# Patient Record
Sex: Female | Born: 1955 | Race: White | Hispanic: No | Marital: Married | Smoking: Never smoker
Health system: Southern US, Community
[De-identification: ages and names within clinical notes are randomized; demographics above are authoritative.]

## PROBLEM LIST (undated history)

## (undated) DIAGNOSIS — Z780 Asymptomatic menopausal state: Secondary | ICD-10-CM

## (undated) DIAGNOSIS — I10 Essential (primary) hypertension: Secondary | ICD-10-CM

## (undated) DIAGNOSIS — I7300 Raynaud's syndrome without gangrene: Secondary | ICD-10-CM

## (undated) DIAGNOSIS — T8859XA Other complications of anesthesia, initial encounter: Secondary | ICD-10-CM

## (undated) DIAGNOSIS — Z9889 Other specified postprocedural states: Secondary | ICD-10-CM

## (undated) HISTORY — DX: Asymptomatic menopausal state: Z78.0

## (undated) HISTORY — PX: EYE SURGERY: SHX253

## (undated) HISTORY — PX: FOOT SURGERY: SHX648

## (undated) HISTORY — PX: CATARACT EXTRACTION: SUR2

---

## 1978-07-15 HISTORY — PX: DERMOID CYST  EXCISION: SHX1452

## 1998-01-17 ENCOUNTER — Other Ambulatory Visit: Admission: RE | Admit: 1998-01-17 | Discharge: 1998-01-17 | Payer: Self-pay | Admitting: Obstetrics and Gynecology

## 1998-03-07 ENCOUNTER — Other Ambulatory Visit: Admission: RE | Admit: 1998-03-07 | Discharge: 1998-03-07 | Payer: Self-pay | Admitting: Obstetrics and Gynecology

## 1999-01-25 ENCOUNTER — Other Ambulatory Visit: Admission: RE | Admit: 1999-01-25 | Discharge: 1999-01-25 | Payer: Self-pay | Admitting: Obstetrics and Gynecology

## 1999-04-04 ENCOUNTER — Other Ambulatory Visit: Admission: RE | Admit: 1999-04-04 | Discharge: 1999-04-04 | Payer: Self-pay | Admitting: Obstetrics and Gynecology

## 1999-04-04 ENCOUNTER — Encounter (INDEPENDENT_AMBULATORY_CARE_PROVIDER_SITE_OTHER): Payer: Self-pay

## 2000-01-14 ENCOUNTER — Other Ambulatory Visit: Admission: RE | Admit: 2000-01-14 | Discharge: 2000-01-14 | Payer: Self-pay | Admitting: Obstetrics and Gynecology

## 2000-05-01 ENCOUNTER — Encounter: Admission: RE | Admit: 2000-05-01 | Discharge: 2000-05-01 | Payer: Self-pay | Admitting: Orthopedic Surgery

## 2000-05-01 ENCOUNTER — Encounter: Payer: Self-pay | Admitting: Orthopedic Surgery

## 2000-07-29 ENCOUNTER — Other Ambulatory Visit: Admission: RE | Admit: 2000-07-29 | Discharge: 2000-07-29 | Payer: Self-pay | Admitting: Obstetrics and Gynecology

## 2001-03-31 ENCOUNTER — Other Ambulatory Visit: Admission: RE | Admit: 2001-03-31 | Discharge: 2001-03-31 | Payer: Self-pay | Admitting: Obstetrics and Gynecology

## 2001-05-06 ENCOUNTER — Encounter (INDEPENDENT_AMBULATORY_CARE_PROVIDER_SITE_OTHER): Payer: Self-pay

## 2001-05-06 ENCOUNTER — Other Ambulatory Visit: Admission: RE | Admit: 2001-05-06 | Discharge: 2001-05-06 | Payer: Self-pay | Admitting: Otolaryngology

## 2001-05-06 ENCOUNTER — Encounter: Payer: Self-pay | Admitting: Orthopedic Surgery

## 2001-05-06 ENCOUNTER — Encounter: Admission: RE | Admit: 2001-05-06 | Discharge: 2001-05-06 | Payer: Self-pay | Admitting: Orthopedic Surgery

## 2002-04-05 ENCOUNTER — Other Ambulatory Visit: Admission: RE | Admit: 2002-04-05 | Discharge: 2002-04-05 | Payer: Self-pay | Admitting: Obstetrics and Gynecology

## 2002-08-05 ENCOUNTER — Encounter: Payer: Self-pay | Admitting: Orthopedic Surgery

## 2002-08-05 ENCOUNTER — Encounter: Admission: RE | Admit: 2002-08-05 | Discharge: 2002-08-05 | Payer: Self-pay | Admitting: Orthopedic Surgery

## 2002-09-09 ENCOUNTER — Encounter: Payer: Self-pay | Admitting: Gastroenterology

## 2002-09-09 ENCOUNTER — Encounter: Admission: RE | Admit: 2002-09-09 | Discharge: 2002-09-09 | Payer: Self-pay | Admitting: Gastroenterology

## 2003-04-19 ENCOUNTER — Other Ambulatory Visit: Admission: RE | Admit: 2003-04-19 | Discharge: 2003-04-19 | Payer: Self-pay | Admitting: Obstetrics and Gynecology

## 2004-02-08 ENCOUNTER — Encounter: Admission: RE | Admit: 2004-02-08 | Discharge: 2004-02-08 | Payer: Self-pay | Admitting: Obstetrics and Gynecology

## 2004-04-18 ENCOUNTER — Ambulatory Visit (HOSPITAL_BASED_OUTPATIENT_CLINIC_OR_DEPARTMENT_OTHER): Admission: RE | Admit: 2004-04-18 | Discharge: 2004-04-18 | Payer: Self-pay | Admitting: Obstetrics and Gynecology

## 2004-04-18 ENCOUNTER — Ambulatory Visit (HOSPITAL_COMMUNITY): Admission: RE | Admit: 2004-04-18 | Discharge: 2004-04-18 | Payer: Self-pay | Admitting: Obstetrics and Gynecology

## 2004-04-18 ENCOUNTER — Encounter (INDEPENDENT_AMBULATORY_CARE_PROVIDER_SITE_OTHER): Payer: Self-pay | Admitting: Specialist

## 2005-02-14 ENCOUNTER — Encounter: Admission: RE | Admit: 2005-02-14 | Discharge: 2005-02-14 | Payer: Self-pay | Admitting: Orthopedic Surgery

## 2005-03-14 ENCOUNTER — Encounter: Admission: RE | Admit: 2005-03-14 | Discharge: 2005-03-14 | Payer: Self-pay | Admitting: Orthopedic Surgery

## 2005-03-26 ENCOUNTER — Encounter: Admission: RE | Admit: 2005-03-26 | Discharge: 2005-03-26 | Payer: Self-pay | Admitting: Orthopedic Surgery

## 2005-06-03 ENCOUNTER — Ambulatory Visit: Admission: RE | Admit: 2005-06-03 | Discharge: 2005-06-04 | Payer: Self-pay | Admitting: Plastic Surgery

## 2005-06-03 ENCOUNTER — Ambulatory Visit (HOSPITAL_COMMUNITY): Admission: RE | Admit: 2005-06-03 | Discharge: 2005-06-04 | Payer: Self-pay | Admitting: Neurosurgery

## 2005-07-15 HISTORY — PX: NECK SURGERY: SHX720

## 2006-02-25 ENCOUNTER — Encounter: Admission: RE | Admit: 2006-02-25 | Discharge: 2006-02-25 | Payer: Self-pay | Admitting: Obstetrics and Gynecology

## 2007-03-23 ENCOUNTER — Encounter: Admission: RE | Admit: 2007-03-23 | Discharge: 2007-03-23 | Payer: Self-pay | Admitting: Obstetrics and Gynecology

## 2007-03-25 ENCOUNTER — Encounter: Admission: RE | Admit: 2007-03-25 | Discharge: 2007-03-25 | Payer: Self-pay | Admitting: Obstetrics and Gynecology

## 2007-11-26 ENCOUNTER — Ambulatory Visit (HOSPITAL_COMMUNITY): Admission: RE | Admit: 2007-11-26 | Discharge: 2007-11-26 | Payer: Self-pay | Admitting: Gastroenterology

## 2007-11-26 ENCOUNTER — Encounter (INDEPENDENT_AMBULATORY_CARE_PROVIDER_SITE_OTHER): Payer: Self-pay | Admitting: Gastroenterology

## 2008-03-23 ENCOUNTER — Encounter: Admission: RE | Admit: 2008-03-23 | Discharge: 2008-03-23 | Payer: Self-pay | Admitting: Obstetrics and Gynecology

## 2009-03-24 ENCOUNTER — Encounter: Admission: RE | Admit: 2009-03-24 | Discharge: 2009-03-24 | Payer: Self-pay | Admitting: Obstetrics and Gynecology

## 2010-03-26 ENCOUNTER — Encounter: Admission: RE | Admit: 2010-03-26 | Discharge: 2010-03-26 | Payer: Self-pay | Admitting: Obstetrics and Gynecology

## 2010-05-08 ENCOUNTER — Ambulatory Visit: Payer: Self-pay

## 2010-07-15 HISTORY — PX: BREAST ENHANCEMENT SURGERY: SHX7

## 2010-07-30 ENCOUNTER — Ambulatory Visit
Admission: RE | Admit: 2010-07-30 | Discharge: 2010-07-30 | Payer: Self-pay | Source: Home / Self Care | Attending: Orthopedic Surgery | Admitting: Orthopedic Surgery

## 2010-08-07 LAB — ANAEROBIC CULTURE

## 2010-08-07 LAB — WOUND CULTURE

## 2010-11-27 NOTE — Op Note (Signed)
NAME:  Claudia Rivera, Claudia Rivera NO.:  [id number]   MEDICAL RECORD NO.:  [id number]          PATIENT TYPE:  AMB   LOCATION:  ENDO                         FACILITY:  [facility]   PHYSICIAN:  Petra Kuba, M.D.    DATE OF BIRTH:  1956-02-22   DATE OF PROCEDURE:  11/26/2007  DATE OF DISCHARGE:                               OPERATIVE REPORT   PROCEDURE:  Colonoscopy with biopsy.   INDICATIONS:  Patient with gas, bloating, abdominal pain, recent [place] travel.  Almost due for colonic screening.  Consent was signed  after risks, benefits, methods, options thoroughly discussed multiple  times in the past.   MEDICINES USED:  Per anesthesia propofol 150 mg, fentanyl 50 mcg, Versed  1 mg.   PROCEDURE:  Rectal inspection is pertinent for external hemorrhoids,  small.  Digital exam was negative.  The video pediatric colonoscope was  inserted, easily advanced around the colon to the cecum.  This did  require some abdominal pressure but no position changes.  No obvious  abnormality was seen on insertion.  The cecum was identified by the  appendiceal orifice and ileocecal valve.  In fact the scope was inserted  a short ways into the terminal ileum which was normal.  Photodocumentation and a few random biopsies were obtained put in the  first container.  Scope was slowly withdrawn.  The prep was adequate.  There was some liquid stool that required washing and suctioning.  On  slow withdrawal through the colon, random colon biopsies were obtained  and put in the second container.  On slow withdrawal, the cecum,  ascending, transverse and descending were normal.  In the mid to distal  sigmoid one small diverticulum was seen. As scope was withdrawn back to  the rectum, no other abnormalities were seen.  The random biopsies were  obtained and put in the second container.  Anorectal pull-through and  retroflexion confirmed some tiny to small hemorrhoids and did reveal two  tiny rectal  polyps on retroflexion, both of which were biopsied and put  in the third container.  Scope was straightened, readvanced a short ways  up the left side of the colon.  Air was suctioned, scope removed.  The  patient tolerated the procedure well.  There was no obvious immediate  complication.   ENDOSCOPIC DIAGNOSES:  1. Internal-external tiny small hemorrhoids.  2. Two tiny rectal polyps on retroflexion, status post biopsy.  3. One sigmoid diverticulum.  4. Otherwise within normal limits to the terminal ileum, status post      random biopsies throughout.   PLAN:  Await pathology.  If nondiagnostic, consider retrial with  antibiotics or Xifaxan.  Repeat colon screening in 5-10 years and happy  to see back p.r.n.           ______________________________  Petra Kuba, M.D.     MEM/MEDQ  D:  11/26/2007  T:  11/26/2007  Job:  [id number]

## 2010-11-30 NOTE — Op Note (Signed)
NAME:  Claudia Rivera, Claudia Rivera NO.:  [id number]   MEDICAL RECORD NO.:  [id number]          PATIENT TYPE:  AMB   LOCATION:  NESC                         FACILITY:  [facility]   PHYSICIAN:  Sherry A. Dickstein, M.D.DATE OF BIRTH:  October 06, 1955   DATE OF PROCEDURE:  04/18/2004  DATE OF DISCHARGE:                                 OPERATIVE REPORT   PREOPERATIVE DIAGNOSIS:  Endometrial polyp and menorrhagia.   POSTOPERATIVE DIAGNOSIS:  Endometrial polyp and menorrhagia.   SURGEON:  Sherry A. Rosalio Macadamia, M.D.   ANESTHESIA:  MAC.   PROCEDURE:  D&C, hysteroscopy with resectoscope.   INDICATIONS FOR PROCEDURE:  This is a 55 year old G3, P2-1-0-3 woman who has  menstrual periods every 28 days lasting approximately five to seven days,  with excessively heavy flow.  The patient is changing a pad every hour with  clotting.  The patient also has some irregular bleeding in between periods  at this time.  The patient had workup showing normal CBC and an ultrasound.  Ultrasound revealed thickened endometrium that was consistent with  endometrial polyp.  Because of this, the patient is brought to the operating  room for [facility], hysteroscopy with resectoscope.   FINDINGS:  Normal anteflexed uterus.  No adnexal mass.  Endometrial polyp  present.   DESCRIPTION OF PROCEDURE:  The patient was brought into the operating room  and given adequate IV sedation.  She was placed in the dorsal lithotomy  position.  Her perineum was washed with Hibiclens.  Pelvic examination was  performed.  The surgeon's gown and gloves were changed.  The patient was  draped in a sterile fashion.   A speculum was placed within the vagina.  The vagina was washed with  Hibiclens.  A paracervical block was administered with 1% Nesacaine.  The  anterior lip of the cervix was grasped with a single-tooth tenaculum.  The  cervix was dilated with Pratt dilators to a #31.  The hysteroscope was  introduced into the endometrial  cavity.  Pictures were obtained.  An  endometrial polyp was seen at the cervical lower uterine segment junction.  The endometrial polyp was resected.  Endometrial resections were taken  circumferentially from the endometrial cavity.  There was adequate  hemostasis in the endometrial cavity.  The hysteroscope was removed.  The  tenaculum was removed.  A small amount of bleeding on the cervix from the  tenaculum site was stopped with Monsel's solution.  All instruments were  removed from the vagina.   The patient was taken out of the dorsal lithotomy position.  She was  awakened.  She was moved from the operating table to the stretcher in stable  condition.   COMPLICATIONS:  None.   ESTIMATED BLOOD LOSS:  Less than 5 cc.  Sorbitol differential -70 cc.   SPECIMENS:  1.  Endometrial polyp.  2.  Endometrial resections.      SAD/MEDQ  D:  04/18/2004  T:  04/18/2004  Job:  [id number]

## 2010-11-30 NOTE — Op Note (Signed)
NAME:  Claudia Rivera, TALSMA NO.:  [id number]   MEDICAL RECORD NO.:  [id number]          PATIENT TYPE:  AMB   LOCATION:  SDS                          FACILITY:  [facility]   PHYSICIAN:  Hewitt Shorts, M.D.DATE OF BIRTH:  01-05-1956   DATE OF PROCEDURE:  06/03/2005  DATE OF DISCHARGE:                                 OPERATIVE REPORT   PREOPERATIVE DIAGNOSIS:  Cervical spondylosis, degenerative disc disease,  radiculopathy, and neck pain.   POSTOPERATIVE DIAGNOSIS:  Cervical spondylosis, degenerative disc disease,  radiculopathy, and neck pain.   PROCEDURE:  C5-C6 and C6-C7 anterior cervical discectomy and arthrodesis  with VG2 allograft and cervical plating.   SURGEON:  Hewitt Shorts, M.D.   ASSISTANT:  Stefani Dama, M.D.   ANESTHESIA:  General endotracheal anesthesia.   INDICATIONS FOR PROCEDURE:  The patient is a 55 year old woman who presented  with a right cervical radiculopathy and was found to have advanced  spondylosis and degenerative disc disease at the C5-C6 and C6-C7 levels.  The decision was made to proceed with a two level anterior cervical  discectomy and arthrodesis.   PROCEDURE:  The patient was brought to the operating room and placed under  general endotracheal anesthesia.  The patient was placed in 10 pounds of  halter traction.  The neck was prepped with Betadine solution and draped in  a sterile fashion.  A horizontal incision was made on the left side of the  neck, prior to this, the incision was infiltrated with local anesthetic with  epinephrine.  The dissection was carried down through the subcutaneous  tissue and platysma.  Bipolar cautery and electrocautery was used to  maintain hemostasis.  Dissection was carried out in an avascular plane with  the sternocleidomastoid muscle, carotid artery, and jugular vein laterally  and trachea and esophagus medially.  The ventral aspect of the vertebral  column was identified and a  localizing x-ray was taken and the C5-C6 and C6-  C7 intervertebral disc space was identified.  Discectomy was begun with  incision in the annulus and continued with microcurets and pituitary  rongeurs.  The cartilaginous endplates of the corresponding vertebra were  removed using microcuret and the X-Max drill.  The microscope was then  draped and brought onto the field to provide additional magnification,  illumination, and visualization and the remainder of the decompression was  performed using microdissection and microsurgical technique.  There was  significant posterior osteophyte overgrowth, this was removed using the X-  Max drill along with the 2 mm Kerrison punch with a thin footplate.  The  posterior longitudinal ligament, which was thickened was removed, and we  were then able to decompress the spinal canal and thecal sac.  We then  turned our attention to the neural foramina.  There was significant  spondylitic overgrowth within the neural foramina.  This spondylitic  overgrowth was carefully removed decompressing the exiting nerve roots  bilaterally at each level.  Once the decompression was completed, we  measured the height of the intervertebral disc space.  We selected a 6 mm  graft for the C5-C6 level and 7 mm graft for the C6-C7 level.  Each of the  grafts was hydrated in saline solution, positioned in the intervertebral  disc space and counter sunk.  We then selected a 35 mm tethered cervical  plate, it was positioned over the fusion construct.  We used 4 by 14 mm  screws, a single screw was placed at C6, a pair of screws was placed at C5  and C7.  Each of the screw holes was drilled and tapped and the screws  placed in alternating fashion.  Once all five screws were placed, final  tightening was done of the screws, and then an x-ray was taken which showed  the grafts to be in good position, the plate and screws in good position,  the overall alignment was good.  The  wound was irrigated with Bacitracin  solution, checked for hemostasis which was established and confirmed, and  then we proceeded with closure.  The platysma was closed with interrupted  inverted 2-0 Vicryl suture, the subcutaneous and subcuticular layer was  closed with interrupted inverted 3-0 Vicryl sutures, and the skin was  reapproximated with Dermabond.  The patient tolerated the procedure well.  Estimated blood loss was 50 mL.  Sponge and needle counts were correct.  At  the completion of the neurosurgical procedure, the patient was scheduled for  a second procedure to be performed by Dr. Delia Chimes, specifically, a  bilateral blepharoplasty, and the patient's care was turned over to Dr.  Benna Dunks.  The patient was continued under general anesthesia and will be  evaluated subsequently in the recovery room after the plastic surgical  procedure is completed.      Hewitt Shorts, M.D.  Electronically Signed     RWN/MEDQ  D:  06/03/2005  T:  06/03/2005  Job:  [id number]

## 2010-11-30 NOTE — Op Note (Signed)
NAME:  Claudia Rivera, Claudia Rivera NO.:  [id number]   MEDICAL RECORD NO.:  [id number]          PATIENT TYPE:  AMB   LOCATION:  SDS                          FACILITY:  [facility]   PHYSICIAN:  Alfredia Ferguson, M.D.  DATE OF BIRTH:  [birth year]   DATE OF PROCEDURE:  06/03/2005  DATE OF DISCHARGE:                                 OPERATIVE REPORT   PREOPERATIVE DIAGNOSES:  1.  Dermatochalasia, upper eyelids.  2.  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   POSTOPERATIVE DIAGNOSES:  1.  Dermatochalasia, upper eyelids.  2.  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   OPERATION PERFORMED:  1.  Upper eyelid blepharoplasty.  2.  Lower lid blepharoplasty.   SURGEON:  Alfredia Ferguson, M.D.   ANESTHESIA:  General endotracheal anesthesia.   INDICATION FOR SURGERY:  This is a 55 year old woman who is undergoing  cervical laminectomy, who wishes to also have her eyes done at the  completion of the procedure.  She complains of excess skin of her upper  eyelids and laxity of skin of the lower eyelids with puffiness in the lower  lids.  The patient understands the risk of surgery including bleeding,  infection, unsightly scarring, asymmetry, ectropion, over-resection of  eyelid skin, lagophthalmos, blindness, the need for secondary surgery, and  overall dissatisfaction with the results.  In spite of these and other risks  discussed with the patient, the patient wishes to proceed with the surgery.   DESCRIPTION OF OPERATION:  I was summoned to the operating at the conclusion  of the laminectomy.  The patient's excess skin was marked and a subciliary  skin mark was placed in both lower eyelids.  Local anesthesia using  Xylocaine and 1:1000 epinephrine was infiltrated in both upper and lower  eyelids.  The face was prepped with Betadine and  draped with sterile  drapes.  Attention was first turned to the left upper  eyelid, where a skin  excision was carried out in elliptical fashion.  A narrow sliver of  orbicularis oculi muscle was then removed.  It was approximately 5 mm in  width.  The septum orbitale was opened medially and a small medial fat pad  was visualized.  This fat pad was teased out of its anatomic bed, clamped at  the base, excised, and the base was cauterized.  Hemostasis was meticulously  maintained.  A small tunnel was made in the suborbicularis plane going  inferiorly along the lateral orbital rim for a distance about a centimeter  for later use for tacking up the lower orbicularis muscle.  A left  subciliary incision was now made and a skin flap was elevated for distance  of 5 mm.  The skin flap was now deepened to get beneath the orbicularis  muscle and the dissection continued down to the orbital rim.  An opening was  made the septum orbitale medially in the middle and laterally.  A large  amount of pseudo-herniated fat was identified.  This fat was teased out its  anatomic bed, clamped at the base, excised, and the base was cauterized.  After achieving meticulous hemostasis, a small pennant of orbicularis muscle  was mobilized from the undersurface of the lateral skin flap.  This skin  flap was now pulled up through the previously-created tunnel on the left  side in lateral aspect of the lid incision.  The orbicularis muscle was  pulled a superior direction and tacked down to the lateral orbital rim just  above lateral pupillary line.  The lower eyelid skin was now redraped in the  superior direction.  A conservative skin excision of the excess skin was  carried out.  Both areas were meticulously inspected for hemostasis and then  irrigated.  Prior to closure in and both incisions, attention was turned to  her right side, where an identical procedure was performed.  After excising  excess skin above and below and excising fat below, attention was redirected  to the left side,  where closure was then was carried out.  The left upper  eyelid was closed with a running 6-0 fast-absorbing gut.  A single 6-0 nylon  suture was placed in the midportion of the upper eyelid skin to suspend it  closed.  The lower eyelid incision was closed by approximating the lateral  one-fourth the incision with interrupted simple sutures of 6-0 nylon.  The  remainder of the lower level incision was closed with a rapid-absorbing 6-0  gut suture.  Closure on the right side was carried out in identical fashion.  Symmetry appeared to be acceptable at conclusion of the procedure.  There  was minimal blood loss throughout.  The patient's face was cleansed and  dried and cold packs were placed over both eyes.  The patient was awakened,  extubated and transported to the recovery room in satisfactory condition.      Alfredia Ferguson, M.D.  Electronically Signed     WBB/MEDQ  D:  06/03/2005  T:  06/04/2005  Job:  [id number]

## 2010-12-26 ENCOUNTER — Other Ambulatory Visit: Payer: Self-pay | Admitting: Obstetrics & Gynecology

## 2010-12-26 DIAGNOSIS — Z780 Asymptomatic menopausal state: Secondary | ICD-10-CM

## 2011-02-04 ENCOUNTER — Other Ambulatory Visit: Payer: Self-pay | Admitting: Sports Medicine

## 2011-02-04 DIAGNOSIS — IMO0002 Reserved for concepts with insufficient information to code with codable children: Secondary | ICD-10-CM

## 2011-02-06 ENCOUNTER — Ambulatory Visit
Admission: RE | Admit: 2011-02-06 | Discharge: 2011-02-06 | Disposition: A | Discharge status: Home / Self Care | Payer: BC Managed Care – PPO | Source: Ambulatory Visit | Attending: Sports Medicine | Admitting: Sports Medicine

## 2011-02-06 DIAGNOSIS — IMO0002 Reserved for concepts with insufficient information to code with codable children: Secondary | ICD-10-CM

## 2011-02-06 MED ORDER — IOHEXOL 300 MG/ML  SOLN
1.0000 mL | Freq: Once | INTRAMUSCULAR | Status: AC | PRN
  Administered 2011-02-06: 1 mL via EPIDURAL

## 2011-02-06 MED ORDER — TRIAMCINOLONE ACETONIDE 40 MG/ML IJ SUSP (RADIOLOGY)
60.0000 mg | Freq: Once | INTRAMUSCULAR | Status: AC
  Administered 2011-02-06: 60 mg via EPIDURAL

## 2011-03-05 ENCOUNTER — Other Ambulatory Visit: Payer: Self-pay | Admitting: Obstetrics & Gynecology

## 2011-03-05 DIAGNOSIS — Z1231 Encounter for screening mammogram for malignant neoplasm of breast: Secondary | ICD-10-CM

## 2011-03-19 ENCOUNTER — Ambulatory Visit (INDEPENDENT_AMBULATORY_CARE_PROVIDER_SITE_OTHER): Payer: BC Managed Care – PPO | Admitting: Internal Medicine

## 2011-03-19 ENCOUNTER — Encounter: Payer: Self-pay | Admitting: Internal Medicine

## 2011-03-19 VITALS — BP 136/86 | HR 65 | Temp 97.4°F | Resp 12 | Ht 68.0 in | Wt 106.0 lb

## 2011-03-19 DIAGNOSIS — Z780 Asymptomatic menopausal state: Secondary | ICD-10-CM | POA: Insufficient documentation

## 2011-03-19 DIAGNOSIS — N951 Menopausal and female climacteric states: Secondary | ICD-10-CM

## 2011-03-19 DIAGNOSIS — G43909 Migraine, unspecified, not intractable, without status migrainosus: Secondary | ICD-10-CM | POA: Insufficient documentation

## 2011-03-19 DIAGNOSIS — R636 Underweight: Secondary | ICD-10-CM | POA: Insufficient documentation

## 2011-03-19 LAB — FOLLICLE STIMULATING HORMONE: FSH: 76.3 m[IU]/mL

## 2011-03-19 LAB — COMPREHENSIVE METABOLIC PANEL
ALT: 19 U/L (ref 0–35)
AST: 30 U/L (ref 0–37)
Albumin: 4.5 g/dL (ref 3.5–5.2)
Alkaline Phosphatase: 44 U/L (ref 39–117)
BUN: 15 mg/dL (ref 6–23)
CO2: 28 mEq/L (ref 19–32)
Calcium: 9.6 mg/dL (ref 8.4–10.5)
Chloride: 105 mEq/L (ref 96–112)
Creat: 0.67 mg/dL (ref 0.50–1.10)
Glucose, Bld: 91 mg/dL (ref 70–99)
Potassium: 4.2 mEq/L (ref 3.5–5.3)
Sodium: 142 mEq/L (ref 135–145)
Total Bilirubin: 0.3 mg/dL (ref 0.3–1.2)
Total Protein: 6.6 g/dL (ref 6.0–8.3)

## 2011-03-19 LAB — POCT URINALYSIS DIPSTICK
Bilirubin, UA: NEGATIVE
Blood, UA: NEGATIVE
Glucose, UA: NEGATIVE
Ketones, UA: NEGATIVE
Leukocytes, UA: NEGATIVE
Nitrite, UA: NEGATIVE
Protein, UA: NEGATIVE
Spec Grav, UA: 1.01
Urobilinogen, UA: NORMAL
pH, UA: 6.5

## 2011-03-19 LAB — LUTEINIZING HORMONE: LH: 27.8 m[IU]/mL

## 2011-03-19 MED ORDER — PROGESTERONE MICRONIZED 100 MG PO CAPS: 100.0000 mg | ORAL_CAPSULE | Freq: Every day | ORAL | Status: DC

## 2011-03-19 MED ORDER — ESCITALOPRAM OXALATE 10 MG PO TABS: 10.0000 mg | ORAL_TABLET | Freq: Every day | ORAL | Status: DC

## 2011-03-19 MED ORDER — ESTRADIOL 0.05 MG/24HR TD PTTW: MEDICATED_PATCH | TRANSDERMAL | Status: DC

## 2011-03-19 NOTE — Patient Instructions (Signed)
Take vivelle dot and change 2 times per week  Take prometrium every day  Schedule CPE -  Send mammogram report to me

## 2011-03-19 NOTE — Progress Notes (Signed)
Subjective:    Patient ID: Claudia Rivera, female    DOB: Aug 02, 1955, 55 y.o.   MRN: [id number]  HPI Claudia Rivera is here as a new pt for the first visit.  Claudia Rivera has a history of migraine headaches treated by Dr. Sandria Manly with Maxalt, and Claudia Rivera has had , menopausal symptoms.  Claudia Rivera describes Claudia Rivera was seeing Dr. Juliene Pina at [facility] and treated with Vivelle dot and was supposed to be taking progesterone as well but there was some mix up with meds.  Claudia Rivera had a few months without progesterone and [name] reports that Dr. Juliene Pina did do an ultrasound and pt was told her endometrial lining was OK.  Claudia Rivera continues to wake up at night with occasional night sweats.  [name] has no personal or FH of CVD, of breast CA, no GYN cancers,  no FH of DVT or PE, or liver disease  Her last mammogram was reported normal and Claudia Rivera is due for her yearly in a couple of weeks.  Some vaginal dryness and Claudia Rivera describes losing urine with coughing, sneezing.  Claudia Rivera does tend to get frequent UTI's by her report.  Former primary care from Dr. Farris Has or Dr. Margaretha Sheffield at her husband's office.  Allergies  Allergen Reactions  . Codeine Hives and Itching   Past Medical History  Diagnosis Date  . Migraine     Dr. Sandria Manly  . Menopause    Past Surgical History  Procedure Date  . Breast enhancement surgery 2012  . Dermoid cyst  excision 1980  . Neck surgery 2007    fusion C6/7   History   Social History  . Marital Status: Married    Spouse Name: N/A    Number of Children: N/A  . Years of Education: N/A   Occupational History  . Not on file.   Social History Main Topics  . Smoking status: Never Smoker   . Smokeless tobacco: Never Used  . Alcohol Use: 1.5 oz/week    3 drink(s) per week  . Drug Use: No  . Sexually Active: Yes    Birth Control/ Protection: Post-menopausal   Other Topics Concern  . Not on file   Social History Narrative  . No narrative on file   Family History  Problem Relation Age of Onset  . COPD Mother   . Cancer Father    prostate   Patient Active Problem List  Diagnoses  . Migraine  . Menopause  . Underweight   No current outpatient prescriptions on file prior to visit.       Review of Systems    No chest pain, no Sob, no Le edema  Objective:   Physical Exam  Physical Exam  Nursing note and vitals reviewed.  Constitutional: Claudia Rivera is oriented to person, place, and time. Claudia Rivera is pleasant and appears thin.  See BMI  HENT:  Head: Normocephalic and atraumatic.  Cardiovascular: Normal rate and regular rhythm. Exam reveals no gallop and no friction rub.  No murmur heard.  Pulmonary/Chest: Breath sounds normal. Claudia Rivera has no wheezes. Claudia Rivera has no rales.  Neurological: Claudia Rivera is alert and oriented to person, place, and time.  Skin: Skin is warm and dry.  Psychiatric: Claudia Rivera has a normal mood and affect. Her behavior is normal.         Assessment & Plan:  1)  Symptomatic Menopause:  I discussed in detail the risks/benefits of HT.  Claudia Rivera is aware of risks of CV disease and cancers.  Pt has signed the information sheet.  Will initiate Vivelle dot .05 mg 2 times per week with daily Prometrium 100 mg.  Claudia Rivera voices understanding.  Claudia Rivera is to have next mammogram sent to me  2)  Mood irritability:  Claudia Rivera is on Lexapro for this and OK to re-order  3)  Underweight  Claudia Rivera is to schedule a CPE and will further discuss this at that time  4)  Mixed incontinence:  Will check U/A today.  Will further discuss at CPE  I spent 45 minutes with this pt

## 2011-03-26 ENCOUNTER — Encounter: Payer: Self-pay | Admitting: Internal Medicine

## 2011-03-28 ENCOUNTER — Other Ambulatory Visit: Payer: Self-pay | Admitting: Internal Medicine

## 2011-03-28 ENCOUNTER — Ambulatory Visit
Admission: RE | Admit: 2011-03-28 | Discharge: 2011-03-28 | Disposition: A | Discharge status: Home / Self Care | Payer: BC Managed Care – PPO | Source: Ambulatory Visit | Attending: Obstetrics & Gynecology | Admitting: Obstetrics & Gynecology

## 2011-03-28 DIAGNOSIS — Z780 Asymptomatic menopausal state: Secondary | ICD-10-CM

## 2011-03-28 DIAGNOSIS — Z1231 Encounter for screening mammogram for malignant neoplasm of breast: Secondary | ICD-10-CM

## 2011-04-04 ENCOUNTER — Ambulatory Visit: Payer: BC Managed Care – PPO | Admitting: Internal Medicine

## 2011-04-05 ENCOUNTER — Encounter: Payer: Self-pay | Admitting: Emergency Medicine

## 2011-04-11 ENCOUNTER — Other Ambulatory Visit: Payer: Self-pay | Admitting: Internal Medicine

## 2011-04-11 ENCOUNTER — Ambulatory Visit (INDEPENDENT_AMBULATORY_CARE_PROVIDER_SITE_OTHER): Payer: BC Managed Care – PPO | Admitting: Internal Medicine

## 2011-04-11 ENCOUNTER — Encounter: Payer: Self-pay | Admitting: Internal Medicine

## 2011-04-11 VITALS — BP 122/80 | HR 86 | Temp 97.3°F | Resp 16 | Ht 68.0 in | Wt 103.0 lb

## 2011-04-11 DIAGNOSIS — Z113 Encounter for screening for infections with a predominantly sexual mode of transmission: Secondary | ICD-10-CM

## 2011-04-11 DIAGNOSIS — Z23 Encounter for immunization: Secondary | ICD-10-CM

## 2011-04-11 DIAGNOSIS — Z0000 Encounter for general adult medical examination without abnormal findings: Secondary | ICD-10-CM

## 2011-04-11 DIAGNOSIS — G43909 Migraine, unspecified, not intractable, without status migrainosus: Secondary | ICD-10-CM

## 2011-04-11 DIAGNOSIS — Z780 Asymptomatic menopausal state: Secondary | ICD-10-CM

## 2011-04-11 DIAGNOSIS — R636 Underweight: Secondary | ICD-10-CM

## 2011-04-11 DIAGNOSIS — R634 Abnormal weight loss: Secondary | ICD-10-CM

## 2011-04-11 DIAGNOSIS — N951 Menopausal and female climacteric states: Secondary | ICD-10-CM

## 2011-04-11 DIAGNOSIS — Z1272 Encounter for screening for malignant neoplasm of vagina: Secondary | ICD-10-CM

## 2011-04-11 DIAGNOSIS — Z01419 Encounter for gynecological examination (general) (routine) without abnormal findings: Secondary | ICD-10-CM

## 2011-04-11 NOTE — Progress Notes (Signed)
Subjective:    Patient ID: Claudia Rivera, female    DOB: [birth year], 55 y.o.   MRN: [id number]  HPI  Claudia Rivera is here for comprehensive exam.    She was unable to tolerate HT as it caused an increase frequency of her migraine headaches.  She has recently had a visit with Dr. Sandria Manly who counseled to come off all meds except for her prn Maxalt, Lexapro and she was placed on Topamax which she has had for 3 weeks.  Her hot flushes have not been too bothersome.  Still has occasional vaginal dryness  See weight.  She is down 3 lbs but now on Topamax.  Upon further history Claudia Rivera describes that in Jan of 2012 she developed a staph infection of her L index finger.  She was placed on multiple antibiotics at that time which made her quite nauseated and unable to eat.  Since then Claudia Rivera's appetite has improved except for when she gets nauseated from migraines which have been happening quite frequently.  When pain in severe she will throw up.   Dr. Sandria Manly placed her on Topamax 3 weeks ago.  No diarrhea, no abd pain,  She denies FH of GI or GYN malignancies.  Had colonoscopy a few years ago by Dr. Ewing Schlein and was reported negative by    Mammogram dated 9/12 from the [facility] center is negative . Pt has implants.  Recent basic bloodwork from her husband's office is unrevealing.  TSH on low side of normal.    Allergies  Allergen Reactions  . Codeine Hives and Itching   Past Medical History  Diagnosis Date  . Migraine     Dr. Sandria Manly  . Menopause    Past Surgical History  Procedure Date  . Breast enhancement surgery 2012  . Dermoid cyst  excision 1980  . Neck surgery 2007    fusion C6/7   History   Social History  . Marital Status: Married    Spouse Name: N/A    Number of Children: N/A  . Years of Education: N/A   Occupational History  . Not on file.   Social History Main Topics  . Smoking status: Never Smoker   . Smokeless tobacco: Never Used  . Alcohol Use: 1.5 oz/week    3 drink(s) per week  . Drug  Use: No  . Sexually Active: Yes    Birth Control/ Protection: Post-menopausal   Other Topics Concern  . Not on file   Social History Narrative  . No narrative on file   Family History  Problem Relation Age of Onset  . COPD Mother   . Cancer Father     prostate   Patient Active Problem List  Diagnoses  . Migraine  . Menopause  . Underweight   Current Outpatient Prescriptions on File Prior to Visit  Medication Sig Dispense Refill  . escitalopram (LEXAPRO) 10 MG tablet Take 1 tablet (10 mg total) by mouth daily.  30 tablet  5  . rizatriptan (MAXALT) 10 MG tablet Take 10 mg by mouth as needed. May repeat in 2 hours if needed       . estradiol (VIVELLE-DOT) 0.05 MG/24HR Place patch on skin and change 2 times a week  8 patch  5  . progesterone (PROMETRIUM) 100 MG capsule Take 1 capsule (100 mg total) by mouth at bedtime.  30 capsule  5      Review of Systems No chest pain no SOB, no Le edema  No blood or change  in color of stools see HPI Objective:   Physical Exam Physical Exam  Vital signs and nursing note reviewed .  See weight Has lost 3 lbs since last visit Constitutional: She is oriented to person, place, and time. She appears thin. She is cooperative.  HENT:  Head: Normocephalic and atraumatic.  Right Ear: Tympanic membrane normal.  Left Ear: Tympanic membrane normal.  Nose: Nose normal.  Mouth/Throat: Oropharynx is clear and moist and mucous membranes are normal. No oropharyngeal exudate or posterior oropharyngeal erythema.  Eyes: Conjunctivae and EOM are normal. Pupils are equal, round, and reactive to light.  Neck: Neck supple. No JVD present. Carotid bruit is not present. No mass and no thyromegaly present.  Cardiovascular: Regular rhythm, normal heart sounds, intact distal pulses and normal pulses.  Exam reveals no gallop and no friction rub.   No murmur heard. Pulses:      Dorsalis pedis pulses are 2+ on the right side, and 2+ on the left side.    Pulmonary/Chest: Breath sounds normal. She has no wheezes. She has no rhonchi. She has no rales. Right breast exhibits no mass, no nipple discharge and no skin change. Left breast exhibits no mass, no nipple discharge and no skin change.  Abdominal: Soft. Bowel sounds are normal. She exhibits no distension and no mass. There is no hepatosplenomegaly. There is no tenderness. There is no CVA tenderness.  Genitourinary: Rectum normal, vagina normal and uterus normal. Rectal exam shows no mass. Guaiac negative stool. No labial fusion. There is no lesion on the right labia. There is no lesion on the left labia. Cervix exhibits no motion tenderness. Right adnexum displays no mass, no tenderness and no fullness. Left adnexum displays no mass, no tenderness and no fullness. No erythema around the vagina.  Musculoskeletal:       No active synovitis to any joint.    Lymphadenopathy:       Right cervical: No superficial cervical adenopathy present.      Left cervical: No superficial cervical adenopathy present.       Right axillary: No pectoral and no lateral adenopathy present.       Left axillary: No pectoral and no lateral adenopathy present.      Right: No inguinal adenopathy present.       Left: No inguinal adenopathy present.  Neurological: She is alert and oriented to person, place, and time. She has normal strength and normal reflexes. No cranial nerve deficit or sensory deficit. She displays a negative Romberg sign. Coordination and gait normal.  Skin: Skin is warm and dry. No abrasion, no bruising, no ecchymosis and no rash noted. No cyanosis. Nails show no clubbing.  Psychiatric: She has a normal mood and affect. Her speech is normal and behavior is normal.     Assessment & Plan:  1) Health Maintenance:  See scanned HM sheet  Will give influenza today.  Pap done today.  Info given on Shingles vaccine and DASH diet for heart health.  UTD with mammogram and colonoscopy.  Will schedule DEXA 2)   Underweight with wt. .loss:  Weight loss dated back to 1/12.  Will begin more extensive work up to include basic endocrine blood tests with  tumor biomarkers CEA and CA 125, along with Free T3 and Free T4 with cortisol and aldosterone levels.  Etiology of persistant wt. loss unclear at this point.  I advised Claudia Rivera to come to my office to weigh herself once a month on my scale.  She assures  me she is eating high calorie meals along with peanut butter and ice cream.  Her mammogram and colonoscopy have been negative.  She had a brain MRI with Dr. Sandria Manly that was reported normal per pt. If no improvement in weight, will need to consider endocrine consult.  EKG today shows nonspecific TWI limited to V1 otherwise normal Topamax therapy likely to complicate her ability to gain weight.  I am also concerned about effect on bone density and will schedule a DEXA 3) Risk for osteoporosis  See above 4)  Migraines:  Managed by neurologist 5)  Menopause unable to tolerate HT secondary to migraines, symptoms minimal now  She will come to office for weight in one month             Assessment & Plan:

## 2011-04-11 NOTE — Patient Instructions (Signed)
To come to my office once a month to be weighed  Return as needed

## 2011-04-12 LAB — CA 125: CA 125: 6.6 U/mL (ref 0.0–30.2)

## 2011-04-12 LAB — CORTISOL: Cortisol, Plasma: 6.1 ug/dL

## 2011-04-12 LAB — CEA: CEA: 1.1 ng/mL (ref 0.0–5.0)

## 2011-04-15 ENCOUNTER — Encounter: Payer: Self-pay | Admitting: Emergency Medicine

## 2011-04-15 LAB — ALDOSTERONE

## 2011-04-18 ENCOUNTER — Other Ambulatory Visit: Payer: BC Managed Care – PPO

## 2011-04-25 ENCOUNTER — Encounter: Payer: Self-pay | Admitting: Emergency Medicine

## 2011-05-27 ENCOUNTER — Telehealth: Payer: Self-pay | Admitting: Internal Medicine

## 2011-05-27 NOTE — Telephone Encounter (Signed)
Please call pt. and remind her to come in for a weight check here in our office.  Let her know that if she is still losing weight, I will want to get a CT of her abd and pelvis along with a CXR.  I can call her tomorrow if she want s to talk to me

## 2011-05-27 NOTE — Telephone Encounter (Signed)
Spoke with Claudia Rivera.  She states that she thinks she is doing well, does not think she needs the weight check.  When questioned if she feels like she is maintaining or gaining weight, she states she is gaining weight.  Not willing to come in for weight check.  Aware I will let DDS know

## 2011-06-25 ENCOUNTER — Telehealth: Payer: Self-pay | Admitting: Internal Medicine

## 2011-06-25 NOTE — Telephone Encounter (Signed)
Spoke with pt. approx 10;00 am .  She reports while at a party a few days ago, she had a glass of wine and experience a syncopal episode with what was described by witnesses as a seizure.  Her husband was not present to witness but was witnessed  by several physicians.  EMS was called and pt reports her glucose was in the 60's and 4 vials of IV glucose was given.  She has an appt with endocrinologist Dr. Leslie Dales today.    She reports she is eating and denies binging/purging.  I spoke with her husband who also confirms that pt. Is eating and he has not noted any signs of eating disorder behavior.  Pt did not want to come to my office for a weight check and reported to my nurse that she thought she was gaining weight.  I also spoke with Dr. 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  She did not have an MRA.  She has been on Topamax and he suggested she come off this.  Dr. Sandria Manly did recall pt had a seizure around 10 years ago.   Will discuss pt with Dr. Leslie Dales after his evaluation today.  Appropriate to rule out potential endocrine etiologies including insulinoma, adrenal pathology etc.

## 2011-06-26 ENCOUNTER — Telehealth: Payer: Self-pay | Admitting: Internal Medicine

## 2011-06-26 ENCOUNTER — Ambulatory Visit (HOSPITAL_BASED_OUTPATIENT_CLINIC_OR_DEPARTMENT_OTHER): Payer: BC Managed Care – PPO

## 2011-06-26 ENCOUNTER — Ambulatory Visit (HOSPITAL_BASED_OUTPATIENT_CLINIC_OR_DEPARTMENT_OTHER)
Admission: RE | Admit: 2011-06-26 | Discharge: 2011-06-26 | Disposition: A | Discharge status: Home / Self Care | Payer: BC Managed Care – PPO | Source: Ambulatory Visit | Attending: Internal Medicine | Admitting: Internal Medicine

## 2011-06-26 ENCOUNTER — Ambulatory Visit (INDEPENDENT_AMBULATORY_CARE_PROVIDER_SITE_OTHER): Payer: BC Managed Care – PPO | Admitting: Internal Medicine

## 2011-06-26 VITALS — BP 128/88 | HR 67 | Temp 97.0°F | Ht 68.0 in | Wt 98.0 lb

## 2011-06-26 DIAGNOSIS — R932 Abnormal findings on diagnostic imaging of liver and biliary tract: Secondary | ICD-10-CM | POA: Insufficient documentation

## 2011-06-26 DIAGNOSIS — R569 Unspecified convulsions: Secondary | ICD-10-CM

## 2011-06-26 DIAGNOSIS — K7689 Other specified diseases of liver: Secondary | ICD-10-CM | POA: Insufficient documentation

## 2011-06-26 DIAGNOSIS — E162 Hypoglycemia, unspecified: Secondary | ICD-10-CM

## 2011-06-26 DIAGNOSIS — G43909 Migraine, unspecified, not intractable, without status migrainosus: Secondary | ICD-10-CM

## 2011-06-26 DIAGNOSIS — R634 Abnormal weight loss: Secondary | ICD-10-CM

## 2011-06-26 DIAGNOSIS — J449 Chronic obstructive pulmonary disease, unspecified: Secondary | ICD-10-CM | POA: Insufficient documentation

## 2011-06-26 DIAGNOSIS — R0602 Shortness of breath: Secondary | ICD-10-CM | POA: Insufficient documentation

## 2011-06-26 DIAGNOSIS — J4489 Other specified chronic obstructive pulmonary disease: Secondary | ICD-10-CM | POA: Insufficient documentation

## 2011-06-26 MED ORDER — IOHEXOL 300 MG/ML  SOLN
100.0000 mL | Freq: Once | INTRAMUSCULAR | Status: AC | PRN
  Administered 2011-06-26: 100 mL via INTRAVENOUS

## 2011-06-26 NOTE — Telephone Encounter (Signed)
Spoke with pt.   She is undergoing endocrine work up and I will get CT of chest, abd/pelvis with contrast today.  Pt is to come to office this afternoon for weight check .  Will try to get Dr.  Altheimer's labs this afternoon

## 2011-06-27 ENCOUNTER — Encounter: Payer: Self-pay | Admitting: Internal Medicine

## 2011-06-27 ENCOUNTER — Telehealth: Payer: Self-pay | Admitting: Internal Medicine

## 2011-06-27 LAB — ANA: Anti Nuclear Antibody(ANA): NEGATIVE

## 2011-06-27 LAB — COMPREHENSIVE METABOLIC PANEL
ALT: 38 U/L — ABNORMAL HIGH (ref 0–35)
AST: 45 U/L — ABNORMAL HIGH (ref 0–37)
Albumin: 4.8 g/dL (ref 3.5–5.2)
Alkaline Phosphatase: 57 U/L (ref 39–117)
BUN: 15 mg/dL (ref 6–23)
CO2: 24 mEq/L (ref 19–32)
Calcium: 9.7 mg/dL (ref 8.4–10.5)
Chloride: 101 mEq/L (ref 96–112)
Creat: 0.71 mg/dL (ref 0.50–1.10)
Glucose, Bld: 113 mg/dL — ABNORMAL HIGH (ref 70–99)
Potassium: 3.6 mEq/L (ref 3.5–5.3)
Sodium: 137 mEq/L (ref 135–145)
Total Bilirubin: 0.7 mg/dL (ref 0.3–1.2)
Total Protein: 6.7 g/dL (ref 6.0–8.3)

## 2011-06-27 LAB — HIV ANTIBODY (ROUTINE TESTING W REFLEX): HIV: NONREACTIVE

## 2011-06-27 LAB — FERRITIN: Ferritin: 69 ng/mL (ref 10–291)

## 2011-06-27 LAB — VITAMIN B12: Vitamin B-12: 540 pg/mL (ref 211–911)

## 2011-06-27 LAB — C-REACTIVE PROTEIN: CRP: <0.02 mg/dL (ref <0.60)

## 2011-06-27 LAB — FOLATE RBC: RBC Folate: 446 ng/mL (ref >=366)

## 2011-06-27 NOTE — Telephone Encounter (Signed)
Gavin Pound,  OK to send a copy of my note to Dr. Melbourne Abts, Dr. Casimiro Needle Altheimer along with results of CT chest, abd, and pelvis.  Also OK for her husband Dr. Salvatore Marvel to have the note if he should happen to call for it.  Do not send unless he calls for it.

## 2011-06-27 NOTE — Progress Notes (Signed)
Subjective:    Patient ID: Claudia Rivera, female    DOB: Sep 14, 1955, 55 y.o.   MRN: [id number]  HPI Claudia Rivera returns for an acute visit.  She reports she was attending a holiday party a few days prior and had a syncopal episode with reported seizure witnessed by several physicians in attendance.  She was drinking a glass of wine at the time.  EMS was called and pt reports her glucose was found to be in "around 60" and she was given 4 amps of IV glucose and she reports her glucoses remained in the 60's.  I do not have any records from the EMS visit.  Upon further history Claudia Rivera reports she has had multiple fainting spells in her lifetime "at least 50" and she recalls being told that she had a low blood sugar when she was a child.   Of note her last telephone conversation with my nurse in my office , Claudia Rivera felt that she was gaining weight and did not need to come in to the office  for a weight check in the month of November.  She has not had her bone density as yet  She reports her migraines are still quite "bad" and she has been on an upward taper of Topiramate , she believes around 100 mg now.  She report she does not believe it is working well.  She did see Claudia Rivera who has started a work up for any endocrine etiology of her weight loss the question of potentially  hypoglycemia.  Claudia Rivera reports she has always been thin and her baseline weight is around 115 lbs.  She has not had any subsequent syncopal episodes or seizure activity, no palpitations or chest pain.  She reports that she is eating, she has decreased the amount of exercise because she does not want to expend the calories.   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  She has not had an MRA and this is reasonable as part of work up and he felt she could come off her topamax.   Allergies  Allergen Reactions  . Codeine Hives and Itching   Past Medical  History  Diagnosis Date  . Migraine     Claudia Rivera  . Menopause    Past Surgical History  Procedure Date  . Breast enhancement surgery 2012  . Dermoid cyst  excision 1980  . Neck surgery 2007    fusion C6/7   History   Social History  . Marital Status: Married    Spouse Name: N/A    Number of Children: N/A  . Years of Education: N/A   Occupational History  . Not on file.   Social History Main Topics  . Smoking status: Never Smoker   . Smokeless tobacco: Never Used  . Alcohol Use: 1.5 oz/week    3 drink(s) per week  . Drug Use: No  . Sexually Active: Yes    Birth Control/ Protection: Post-menopausal   Other Topics Concern  . Not on file   Social History Narrative  . No narrative on file   Family History  Problem Relation Age of Onset  . COPD Mother   . Cancer Father     prostate   Patient Active Problem List  Diagnoses  . Migraine  . Menopause  . Underweight   Current Outpatient Prescriptions on File Prior to Visit  Medication Sig Dispense  Refill  . escitalopram (LEXAPRO) 10 MG tablet Take 1 tablet (10 mg total) by mouth daily.  30 tablet  5  . rizatriptan (MAXALT) 10 MG tablet Take 10 mg by mouth as needed. May repeat in 2 hours if needed       . topiramate (TOPAMAX) 25 MG tablet Take 50 mg by mouth daily.       Marland Kitchen estradiol (VIVELLE-DOT) 0.05 MG/24HR Place patch on skin and change 2 times a week  8 patch  5  . progesterone (PROMETRIUM) 100 MG capsule Take 1 capsule (100 mg total) by mouth at bedtime.  30 capsule  5        Review of Systems See HPI    Objective:   Physical Exam Physical Exam  Nursing note and vitals reviewed. Weight is now 98 lbs with BMI now 14.9.  Claudia Rivera has lost a total of 8 lbs since I began seeing her in early September.   Constitutional: She is oriented to person, place, and time. She is very thin. See  VS .  She is minimally orthostatic HENT:  Head: Normocephalic and atraumatic.  Cardiovascular: Normal rate and regular rhythm.  Exam reveals no gallop and no friction rub.  No murmur heard.  Pulmonary/Chest: Breath sounds normal. She has no wheezes. She has no rales.  Neurological: She is alert and oriented to person, place, and time.  Skin: Skin is warm and dry.  Psychiatric: She has a normal mood and affect. Her behavior is normal.       Assessment & Plan:  1)  Severe Weight loss.  Claudia Rivera had a whole body CT with contrast this morning and results are pending.  I discussed with her that I want to check her nutritional lab parameters and will check B12, folate and ferritin today along with a Hepatitis panel,  HIV and auto-immune serology with ANA and CRP.  Endocrine  work up pending.       She is also advised to reschedule her bone density and I  will get an Echo soon.  She is going out of town for the holidays for a few days.  Etiology of ongoing weight loss still elusive at this point as she was losing weight even prior to Topiramate initiation.  Certainly topiramate is a likely culprit for this most recent weight loss and I am in agreement, pt needs to come off this med so we can assess any weight changes off this med.      [name] is to return to my office upon my return to town on the Dec. 27th for a weight check.  She has lost over 10% of her usual body weight that was already thin.   Certainly topiramate is a likely recent culprit and I am in agreement, pt needs to come off this med so we can assess any weight changes off Topamax.  She is well aware to keep well hydrated and to increase caloric intake.  I agree it is not in her best interest for daily exercise routine right now.  2)  Migraine H/A  Managed by Claudia Rivera.   Will leave MRA as his discretion 3)  Question of hypoglycemia   Work up pending

## 2011-06-27 NOTE — Patient Instructions (Addendum)
To return in Dec 27th for weight check  Keep well hydrated, increase caloric intake.  Avoid vigorous exercise for now

## 2011-06-28 LAB — HEPATITIS PANEL, ACUTE
HCV Ab: NEGATIVE
Hep A IgM: NEGATIVE
Hep B C IgM: NEGATIVE
Hepatitis B Surface Ag: NEGATIVE

## 2011-06-28 NOTE — Telephone Encounter (Signed)
Note, labs and imaging faxed to Drs. Love and Altheimer's offices

## 2011-07-11 ENCOUNTER — Other Ambulatory Visit (HOSPITAL_COMMUNITY): Payer: Self-pay | Admitting: Gastroenterology

## 2011-07-11 ENCOUNTER — Ambulatory Visit (INDEPENDENT_AMBULATORY_CARE_PROVIDER_SITE_OTHER): Payer: BC Managed Care – PPO | Admitting: Internal Medicine

## 2011-07-11 VITALS — BP 125/75 | HR 70 | Temp 99.1°F | Resp 12 | Ht 67.0 in | Wt 98.1 lb

## 2011-07-11 DIAGNOSIS — R634 Abnormal weight loss: Secondary | ICD-10-CM

## 2011-07-11 DIAGNOSIS — R7989 Other specified abnormal findings of blood chemistry: Secondary | ICD-10-CM

## 2011-07-11 LAB — COMPREHENSIVE METABOLIC PANEL
ALT: 29 U/L (ref 0–35)
AST: 35 U/L (ref 0–37)
Albumin: 4.3 g/dL (ref 3.5–5.2)
Alkaline Phosphatase: 48 U/L (ref 39–117)
BUN: 19 mg/dL (ref 6–23)
CO2: 29 mEq/L (ref 19–32)
Calcium: 9.3 mg/dL (ref 8.4–10.5)
Chloride: 104 mEq/L (ref 96–112)
Creat: 0.71 mg/dL (ref 0.50–1.10)
Glucose, Bld: 93 mg/dL (ref 70–99)
Potassium: 3.8 mEq/L (ref 3.5–5.3)
Sodium: 139 mEq/L (ref 135–145)
Total Bilirubin: 0.4 mg/dL (ref 0.3–1.2)
Total Protein: 6.3 g/dL (ref 6.0–8.3)

## 2011-07-12 ENCOUNTER — Encounter: Payer: Self-pay | Admitting: Emergency Medicine

## 2011-07-12 ENCOUNTER — Encounter: Payer: Self-pay | Admitting: Internal Medicine

## 2011-07-12 NOTE — Progress Notes (Signed)
Subjective:    Patient ID: Claudia Rivera, female    DOB: [birth year], 55 y.o.   MRN: [id number]  HPI Claudia Rivera returns today for follow up and reports she is now off Topamax for one week.  She is only taking her Lexapro and a probiotic and states she feels better of the Topamax.  She reports she does not take any other herbs or supplements other than a probiotic.    She recently returned from a vacation in [place] and reports no further syncopal episodes but still has occasional "shaky spells"  that is relieved when she eats.  She has purchased a glucometer to document her glucoses during these episodes but is still trying to work the glucometer correctly.  Her random venous glucoses and AIC by Dr. Leslie Dales and myself have all been in the normal range.  Thyroid indices including free levels are normal.  See labs.  Nutritional indices,  Lipid profile, HIV,  ANA, hepatitis profile, CRP are all normal.  She has minimal elevation of hepatic transaminases and Full body CT is unrevealing except minimal biliary ductal dilation with evidence of small liver cysts.  Claudia Rivera reports she did see Dr. Ewing Schlein and has an MRI of pancreas pending.  She is also continuing her work up with Dr. Leslie Dales with possible Cotrosyn stim testing. She has also seen a nutritionist recommended by Dr. Leslie Dales and keep a log of caloric intake.  Claudia Rivera reports that she was told she does not have evidence of an eating disorder by the number of calories she is consuming  Allergies  Allergen Reactions  . Codeine Hives and Itching   Past Medical History  Diagnosis Date  . Migraine     Dr. Sandria Manly  . Menopause    Past Surgical History  Procedure Date  . Breast enhancement surgery 2012  . Dermoid cyst  excision 1980  . Neck surgery 2007    fusion C6/7   History   Social History  . Marital Status: Married    Spouse Name: N/A    Number of Children: N/A  . Years of Education: N/A   Occupational History  . Not on file.   Social History  Main Topics  . Smoking status: Never Smoker   . Smokeless tobacco: Never Used  . Alcohol Use: 1.5 oz/week    3 drink(s) per week  . Drug Use: No  . Sexually Active: Yes    Birth Control/ Protection: Post-menopausal   Other Topics Concern  . Not on file   Social History Narrative  . No narrative on file   Family History  Problem Relation Age of Onset  . COPD Mother   . Cancer Father     prostate   Patient Active Problem List  Diagnoses  . Migraine  . Menopause  . Underweight   Current Outpatient Prescriptions on File Prior to Visit  Medication Sig Dispense Refill  . escitalopram (LEXAPRO) 10 MG tablet Take 1 tablet (10 mg total) by mouth daily.  30 tablet  5  . estradiol (VIVELLE-DOT) 0.05 MG/24HR Place patch on skin and change 2 times a week  8 patch  5  . progesterone (PROMETRIUM) 100 MG capsule Take 1 capsule (100 mg total) by mouth at bedtime.  30 capsule  5  . rizatriptan (MAXALT) 10 MG tablet Take 10 mg by mouth as needed. May repeat in 2 hours if needed        No current facility-administered medications on file prior to visit.  Review of Systems See HPI    Objective:   Physical Exam Physical Exam  Nursing note and vitals reviewed. Of note BMI on 12/12 14.9 and BMI today 15.36 Constitutional: She is oriented to person, place, and time.   HENT:  Head: Normocephalic and atraumatic.  Cardiovascular: Normal rate and regular rhythm. Exam reveals no gallop and no friction rub.  No murmur heard.  Pulmonary/Chest: Breath sounds normal. She has no wheezes. She has no rales.  Neurological: She is alert and oriented to person, place, and time.  Skin: Skin is warm and dry.  Psychiatric: She has a normal mood and affect. Her behavior is normal.         Assessment & Plan:  1)  Weight loss, unintentional:  There has been no further weight loss is 2 weeks, Claudia Rivera is now off Topamax for approx 1 week.  As previously mentioned, Topamax certainly a partial culprit  but initial  weight loss predates initiation of Topamax.  Will continue monthly weight checks in my office.  Claudia Rivera is to have her Bone Density study at [facility] once her MRI of pancreas is complete.  Will complete medical work up with Dr. Ewing Schlein and Dr. Leslie Dales.  I did discuss with Claudia Rivera that if she loses 20% of the lower range of normal BMI, this is very concerning and can be dangerous.  Will watch carefully over ensuing few months. 2)  MInimal Lft elevation:  Will recheck today 3)  Question hypoglycemia:  Claudia Rivera is to document glucoses during episodes of tremors and shakiness.

## 2011-07-15 ENCOUNTER — Other Ambulatory Visit (HOSPITAL_COMMUNITY): Payer: Self-pay | Admitting: Gastroenterology

## 2011-07-15 ENCOUNTER — Ambulatory Visit (HOSPITAL_COMMUNITY)
Admission: RE | Admit: 2011-07-15 | Discharge: 2011-07-15 | Disposition: A | Discharge status: Home / Self Care | Payer: BC Managed Care – PPO | Source: Ambulatory Visit | Attending: Gastroenterology | Admitting: Gastroenterology

## 2011-07-15 ENCOUNTER — Other Ambulatory Visit: Payer: Self-pay | Admitting: Internal Medicine

## 2011-07-15 DIAGNOSIS — R634 Abnormal weight loss: Secondary | ICD-10-CM

## 2011-07-15 DIAGNOSIS — K838 Other specified diseases of biliary tract: Secondary | ICD-10-CM | POA: Insufficient documentation

## 2011-07-15 DIAGNOSIS — K7689 Other specified diseases of liver: Secondary | ICD-10-CM | POA: Insufficient documentation

## 2011-07-15 MED ORDER — GADOBENATE DIMEGLUMINE 529 MG/ML IV SOLN
10.0000 mL | Freq: Once | INTRAVENOUS | Status: AC | PRN
  Administered 2011-07-15: 9 mL via INTRAVENOUS

## 2011-07-15 NOTE — Patient Instructions (Signed)
To come to my office for weight checks each month  Document glucoses with glucometer during shaky episodes

## 2011-07-15 NOTE — Procedures (Signed)
Procedure:  Venipuncture under ultrasound guidance Findings:  Right brachial vein access with micropuncture set and 4Fr micropuncture dilator Plan:  MR of abdomen today w/ Gad administration

## 2011-07-24 ENCOUNTER — Encounter: Payer: Self-pay | Admitting: Internal Medicine

## 2011-07-24 ENCOUNTER — Telehealth: Payer: Self-pay | Admitting: Internal Medicine

## 2011-07-24 NOTE — Telephone Encounter (Signed)
Spoke with pt. 07/23/2011 approx 4:30 pm.  She reports she has gained a few pounds when she went to see Dr. Patsi Sears for urinary incontinence.  Work up pending.  She is feeling better off topamax.  Advised to markedly reduce alcohol intake and to avoid appetite suppression at her low weight and to avoid liver effects on metabolism.  Counseled for the next few weeks for no alcohol intake.  She wishes to hold off on EUS or urodynamics for now  She will be come into office for weight check at end of months

## 2011-08-09 ENCOUNTER — Ambulatory Visit: Payer: BC Managed Care – PPO

## 2011-08-09 ENCOUNTER — Telehealth: Payer: Self-pay | Admitting: Emergency Medicine

## 2011-08-09 NOTE — Telephone Encounter (Signed)
Claudia Rivera cancelled her weight check today because of the weather.  She states it was already snowing in [place] and she did not want to come out in the weather for safety reasons.  She states she will call next week to reschedule.  I asked her about her bone density.  She states her husband is working on getting it scheduled at one of his offices and she will have results forwarded here for DDS to review

## 2011-08-20 ENCOUNTER — Ambulatory Visit (INDEPENDENT_AMBULATORY_CARE_PROVIDER_SITE_OTHER): Payer: BC Managed Care – PPO | Admitting: Emergency Medicine

## 2011-08-20 VITALS — Ht 67.0 in | Wt 101.0 lb

## 2011-08-20 DIAGNOSIS — R634 Abnormal weight loss: Secondary | ICD-10-CM

## 2011-09-12 ENCOUNTER — Encounter: Payer: Self-pay | Admitting: Internal Medicine

## 2011-09-12 DIAGNOSIS — M8580 Other specified disorders of bone density and structure, unspecified site: Secondary | ICD-10-CM | POA: Insufficient documentation

## 2011-09-16 ENCOUNTER — Telehealth: Payer: Self-pay | Admitting: Internal Medicine

## 2011-09-16 NOTE — Telephone Encounter (Signed)
Spoke with pt.  She tells me she is continuing to gain weight and taking Tums for her calcium.  Discussed adding Vit D to calcium intake.  I reviewed bone density with her as she has osteopenia.

## 2011-10-02 ENCOUNTER — Encounter: Payer: Self-pay | Admitting: Internal Medicine

## 2011-11-17 ENCOUNTER — Other Ambulatory Visit: Payer: Self-pay | Admitting: Internal Medicine

## 2012-02-24 ENCOUNTER — Other Ambulatory Visit: Payer: Self-pay | Admitting: Internal Medicine

## 2012-02-24 DIAGNOSIS — Z1231 Encounter for screening mammogram for malignant neoplasm of breast: Secondary | ICD-10-CM

## 2012-03-30 ENCOUNTER — Ambulatory Visit
Admission: RE | Admit: 2012-03-30 | Discharge: 2012-03-30 | Disposition: A | Discharge status: Home / Self Care | Payer: BC Managed Care – PPO | Source: Ambulatory Visit | Attending: Internal Medicine | Admitting: Internal Medicine

## 2012-03-30 DIAGNOSIS — Z1231 Encounter for screening mammogram for malignant neoplasm of breast: Secondary | ICD-10-CM

## 2012-11-24 ENCOUNTER — Other Ambulatory Visit: Payer: Self-pay | Admitting: Internal Medicine

## 2012-11-24 NOTE — Telephone Encounter (Signed)
Refill request. LVM message to return call regarding office visit

## 2013-01-21 ENCOUNTER — Encounter: Payer: Self-pay | Admitting: Internal Medicine

## 2013-01-21 ENCOUNTER — Ambulatory Visit (INDEPENDENT_AMBULATORY_CARE_PROVIDER_SITE_OTHER): Payer: BC Managed Care – PPO | Admitting: Internal Medicine

## 2013-01-21 VITALS — BP 130/70 | HR 78 | Temp 97.8°F | Resp 18 | Ht 67.0 in | Wt 105.0 lb

## 2013-01-21 DIAGNOSIS — R636 Underweight: Secondary | ICD-10-CM

## 2013-01-21 DIAGNOSIS — M8580 Other specified disorders of bone density and structure, unspecified site: Secondary | ICD-10-CM

## 2013-01-21 DIAGNOSIS — Z780 Asymptomatic menopausal state: Secondary | ICD-10-CM

## 2013-01-21 DIAGNOSIS — M899 Disorder of bone, unspecified: Secondary | ICD-10-CM

## 2013-01-21 DIAGNOSIS — M949 Disorder of cartilage, unspecified: Secondary | ICD-10-CM

## 2013-01-21 DIAGNOSIS — Z124 Encounter for screening for malignant neoplasm of cervix: Secondary | ICD-10-CM

## 2013-01-21 DIAGNOSIS — Z0000 Encounter for general adult medical examination without abnormal findings: Secondary | ICD-10-CM

## 2013-01-21 DIAGNOSIS — N951 Menopausal and female climacteric states: Secondary | ICD-10-CM

## 2013-01-21 DIAGNOSIS — Z1151 Encounter for screening for human papillomavirus (HPV): Secondary | ICD-10-CM

## 2013-01-21 LAB — POCT URINALYSIS DIPSTICK
Bilirubin, UA: NEGATIVE
Blood, UA: NEGATIVE
Glucose, UA: NEGATIVE
Ketones, UA: NEGATIVE
Leukocytes, UA: NEGATIVE
Nitrite, UA: NEGATIVE
Protein, UA: NEGATIVE
Spec Grav, UA: 1.01
Urobilinogen, UA: NEGATIVE
pH, UA: 7

## 2013-01-21 NOTE — Progress Notes (Signed)
Subjective:    Patient ID: Claudia Rivera, female    DOB: July 24, 1955, 57 y.o.   MRN: [id number]  HPI Airyana is here for CPE.      Overall doing well.  She has gained 7 lbs since her last visit in 2012.    Migraines  Well controlled with Maxalt on a prn basis  Osteopenia  She is due for Dexa in December.  She is taking calcium and Vitamin D.  She sees Dr. Emily Filbert for skin exam.  Kylie does tan frequently.   She tells me she had a steroid injection for a facial pustule that was complicated by a staph infection.  Taijah has completed her antibiotic course.  She has had a little bit of urinary frequency.  Only meds Tasheema is one now is her Maxalt prn,  Lexapro and a probiotic  Allergies  Allergen Reactions  . Codeine Hives and Itching   Past Medical History  Diagnosis Date  . Migraine     Dr. Sandria Manly  . Menopause    Past Surgical History  Procedure Laterality Date  . Breast enhancement surgery  2012  . Dermoid cyst  excision  1980  . Neck surgery  2007    fusion C6/7  . Eye surgery     History   Social History  . Marital Status: Married    Spouse Name: N/A    Number of Children: N/A  . Years of Education: N/A   Occupational History  . Not on file.   Social History Main Topics  . Smoking status: Never Smoker   . Smokeless tobacco: Never Used  . Alcohol Use: 1.5 oz/week    3 drink(s) per week  . Drug Use: No  . Sexually Active: Yes    Birth Control/ Protection: Post-menopausal   Other Topics Concern  . Not on file   Social History Narrative  . No narrative on file   Family History  Problem Relation Age of Onset  . COPD Mother   . Cancer Father     prostate   Patient Active Problem List   Diagnosis Date Noted  . Osteopenia 09/12/2011  . Underweight 03/19/2011  . Migraine   . Menopause    Current Outpatient Prescriptions on File Prior to Visit  Medication Sig Dispense Refill  . LEXAPRO 10 MG tablet TAKE 1 TABLET (10 MG TOTAL) BY MOUTH DAILY.  30 tablet  3  .  rizatriptan (MAXALT) 10 MG tablet Take 10 mg by mouth as needed. May repeat in 2 hours if needed        No current facility-administered medications on file prior to visit.       Review of Systems    see HPI Objective:   Physical Exam  Physical Exam  Vital signs and nursing note reviewed  Constitutional: She is oriented to person, place, and time. She appears well-developed and well-nourished. She is cooperative.  HENT:  Head: Normocephalic and atraumatic.  Right Ear: Tympanic membrane normal.  Left Ear: Tympanic membrane normal.  Nose: Nose normal.  Mouth/Throat: Oropharynx is clear and moist and mucous membranes are normal. No oropharyngeal exudate or posterior oropharyngeal erythema.  Eyes: Conjunctivae and EOM are normal. Pupils are equal, round, and reactive to light.  Neck: Neck supple. No JVD present. Carotid bruit is not present. No mass and no thyromegaly present.  Cardiovascular: Regular rhythm, normal heart sounds, intact distal pulses and normal pulses.  Exam reveals no gallop and no friction rub.   No  murmur heard. Pulses:      Dorsalis pedis pulses are 2+ on the right side, and 2+ on the left side.  Pulmonary/Chest: Breath sounds normal. She has no wheezes. She has no rhonchi. She has no rales. Right breast exhibits no mass, no nipple discharge and no skin change.  Jansen has breast implants.   Left breast exhibits no mass, no nipple discharge and no skin change.  Abdominal: Soft. Bowel sounds are normal. She exhibits no distension and no mass. There is no hepatosplenomegaly. There is no tenderness. There is no CVA tenderness.  Genitourinary: Rectum normal, vagina normal and uterus normal. Rectal exam shows no mass. Guaiac negative stool. No labial fusion. There is no lesion on the right labia. There is no lesion on the left labia. Cervix exhibits no motion tenderness. Right adnexum displays no mass, no tenderness and no fullness. Left adnexum displays no mass, no  tenderness and no fullness. No erythema around the vagina.  Musculoskeletal:       No active synovitis to any joint.    Lymphadenopathy:       Right cervical: No superficial cervical adenopathy present.      Left cervical: No superficial cervical adenopathy present.       Right axillary: No pectoral and no lateral adenopathy present.       Left axillary: No pectoral and no lateral adenopathy present.      Right: No inguinal adenopathy present.       Left: No inguinal adenopathy present.  Neurological: She is alert and oriented to person, place, and time. She has normal strength and normal reflexes. No cranial nerve deficit or sensory deficit. She displays a negative Romberg sign. Coordination and gait normal.  Skin: Skin is warm and dry. No abrasion, no bruising, no ecchymosis and no rash noted. No cyanosis.  Tanned skin.  Nails show no clubbing.  Psychiatric: She has a normal mood and affect. Her speech is normal and behavior is normal.          Assessment & Plan:   Health Maintenance:   See scanned HM sheet. Pap today   She is due for mm in September and Dexa in December.    Will get labs today  U/A normal  Migraine continue  maxalt prn  Menopause  Underweight   BMI  16.4   Heidee has gained 7 lbs since her last visit.  DASH diet given.       Assessment & Plan:

## 2013-01-21 NOTE — Patient Instructions (Addendum)
See me as needed 

## 2013-01-22 LAB — CBC WITH DIFFERENTIAL/PLATELET
Basophils Absolute: 0 10*3/uL (ref 0.0–0.1)
Basophils Relative: 1 % (ref 0–1)
Eosinophils Absolute: 0.1 10*3/uL (ref 0.0–0.7)
Eosinophils Relative: 1 % (ref 0–5)
HCT: 42.7 % (ref 36.0–46.0)
Hemoglobin: 14.5 g/dL (ref 12.0–15.0)
Lymphocytes Relative: 28 % (ref 12–46)
Lymphs Abs: 1.9 10*3/uL (ref 0.7–4.0)
MCH: 29.8 pg (ref 26.0–34.0)
MCHC: 34 g/dL (ref 30.0–36.0)
MCV: 87.7 fL (ref 78.0–100.0)
Monocytes Absolute: 0.5 10*3/uL (ref 0.1–1.0)
Monocytes Relative: 8 % (ref 3–12)
Neutro Abs: 4.3 10*3/uL (ref 1.7–7.7)
Neutrophils Relative %: 62 % (ref 43–77)
Platelets: 192 10*3/uL (ref 150–400)
RBC: 4.87 MIL/uL (ref 3.87–5.11)
RDW: 13.1 % (ref 11.5–15.5)
WBC: 6.9 10*3/uL (ref 4.0–10.5)

## 2013-01-22 LAB — LIPID PANEL
Cholesterol: 214 mg/dL — ABNORMAL HIGH (ref 0–200)
HDL: 101 mg/dL (ref >39)
LDL Cholesterol: 103 mg/dL — ABNORMAL HIGH (ref 0–99)
Total CHOL/HDL Ratio: 2.1 Ratio
Triglycerides: 52 mg/dL (ref <150)
VLDL: 10 mg/dL (ref 0–40)

## 2013-01-22 LAB — COMPREHENSIVE METABOLIC PANEL
ALT: 26 U/L (ref 0–35)
AST: 31 U/L (ref 0–37)
Albumin: 4.3 g/dL (ref 3.5–5.2)
Alkaline Phosphatase: 59 U/L (ref 39–117)
BUN: 20 mg/dL (ref 6–23)
CO2: 32 mEq/L (ref 19–32)
Calcium: 9.2 mg/dL (ref 8.4–10.5)
Chloride: 102 mEq/L (ref 96–112)
Creat: 1.16 mg/dL — ABNORMAL HIGH (ref 0.50–1.10)
Glucose, Bld: 85 mg/dL (ref 70–99)
Potassium: 3.9 mEq/L (ref 3.5–5.3)
Sodium: 140 mEq/L (ref 135–145)
Total Bilirubin: 0.4 mg/dL (ref 0.3–1.2)
Total Protein: 6.3 g/dL (ref 6.0–8.3)

## 2013-01-22 LAB — TSH: TSH: 0.871 u[IU]/mL (ref 0.350–4.500)

## 2013-01-22 LAB — VITAMIN D 25 HYDROXY (VIT D DEFICIENCY, FRACTURES): Vit D, 25-Hydroxy: 45 ng/mL (ref 30–89)

## 2013-03-20 ENCOUNTER — Other Ambulatory Visit: Payer: Self-pay | Admitting: Internal Medicine

## 2013-03-22 NOTE — Telephone Encounter (Signed)
Refill request

## 2013-04-22 ENCOUNTER — Other Ambulatory Visit: Payer: Self-pay

## 2013-04-22 DIAGNOSIS — Z1231 Encounter for screening mammogram for malignant neoplasm of breast: Secondary | ICD-10-CM

## 2013-05-20 ENCOUNTER — Other Ambulatory Visit: Payer: Self-pay

## 2013-05-24 ENCOUNTER — Ambulatory Visit
Admission: RE | Admit: 2013-05-24 | Discharge: 2013-05-24 | Disposition: A | Discharge status: Home / Self Care | Payer: BC Managed Care – PPO | Source: Ambulatory Visit

## 2013-05-24 DIAGNOSIS — Z1231 Encounter for screening mammogram for malignant neoplasm of breast: Secondary | ICD-10-CM

## 2013-06-14 ENCOUNTER — Encounter: Payer: Self-pay | Admitting: *Deleted

## 2013-08-15 ENCOUNTER — Encounter: Payer: Self-pay | Admitting: Internal Medicine

## 2013-08-15 DIAGNOSIS — N3281 Overactive bladder: Secondary | ICD-10-CM | POA: Insufficient documentation

## 2013-08-15 DIAGNOSIS — R109 Unspecified abdominal pain: Secondary | ICD-10-CM | POA: Insufficient documentation

## 2014-01-19 ENCOUNTER — Other Ambulatory Visit: Payer: Self-pay | Admitting: Sports Medicine

## 2014-04-11 ENCOUNTER — Other Ambulatory Visit: Payer: Self-pay | Admitting: *Deleted

## 2014-04-11 DIAGNOSIS — Z0000 Encounter for general adult medical examination without abnormal findings: Secondary | ICD-10-CM

## 2014-04-13 ENCOUNTER — Ambulatory Visit (INDEPENDENT_AMBULATORY_CARE_PROVIDER_SITE_OTHER): Payer: BC Managed Care – PPO | Admitting: Internal Medicine

## 2014-04-13 ENCOUNTER — Other Ambulatory Visit: Payer: Self-pay | Admitting: Internal Medicine

## 2014-04-13 ENCOUNTER — Encounter: Payer: Self-pay | Admitting: Internal Medicine

## 2014-04-13 VITALS — BP 125/73 | HR 66 | Temp 98.1°F | Resp 16 | Ht 66.5 in | Wt 105.0 lb

## 2014-04-13 DIAGNOSIS — M899 Disorder of bone, unspecified: Secondary | ICD-10-CM | POA: Diagnosis not present

## 2014-04-13 DIAGNOSIS — Z0000 Encounter for general adult medical examination without abnormal findings: Secondary | ICD-10-CM

## 2014-04-13 DIAGNOSIS — N63 Unspecified lump in unspecified breast: Secondary | ICD-10-CM

## 2014-04-13 DIAGNOSIS — R636 Underweight: Secondary | ICD-10-CM

## 2014-04-13 DIAGNOSIS — M8580 Other specified disorders of bone density and structure, unspecified site: Secondary | ICD-10-CM

## 2014-04-13 DIAGNOSIS — Z1211 Encounter for screening for malignant neoplasm of colon: Secondary | ICD-10-CM

## 2014-04-13 DIAGNOSIS — Z23 Encounter for immunization: Secondary | ICD-10-CM

## 2014-04-13 DIAGNOSIS — M949 Disorder of cartilage, unspecified: Secondary | ICD-10-CM

## 2014-04-13 DIAGNOSIS — N6310 Unspecified lump in the right breast, unspecified quadrant: Secondary | ICD-10-CM

## 2014-04-13 DIAGNOSIS — G43809 Other migraine, not intractable, without status migrainosus: Secondary | ICD-10-CM

## 2014-04-13 LAB — LIPID PANEL
Cholesterol: 210 mg/dL — ABNORMAL HIGH (ref 0–200)
HDL: 104 mg/dL (ref >39)
LDL Cholesterol: 95 mg/dL (ref 0–99)
Total CHOL/HDL Ratio: 2 Ratio
Triglycerides: 55 mg/dL (ref <150)
VLDL: 11 mg/dL (ref 0–40)

## 2014-04-13 LAB — CBC WITH DIFFERENTIAL/PLATELET
Basophils Absolute: 0.1 10*3/uL (ref 0.0–0.1)
Basophils Relative: 1 % (ref 0–1)
Eosinophils Absolute: 0.1 10*3/uL (ref 0.0–0.7)
Eosinophils Relative: 2 % (ref 0–5)
HCT: 41.8 % (ref 36.0–46.0)
Hemoglobin: 14.4 g/dL (ref 12.0–15.0)
Lymphocytes Relative: 32 % (ref 12–46)
Lymphs Abs: 1.9 10*3/uL (ref 0.7–4.0)
MCH: 30.9 pg (ref 26.0–34.0)
MCHC: 34.4 g/dL (ref 30.0–36.0)
MCV: 89.7 fL (ref 78.0–100.0)
Monocytes Absolute: 0.5 10*3/uL (ref 0.1–1.0)
Monocytes Relative: 9 % (ref 3–12)
Neutro Abs: 3.2 10*3/uL (ref 1.7–7.7)
Neutrophils Relative %: 56 % (ref 43–77)
Platelets: 190 10*3/uL (ref 150–400)
RBC: 4.66 MIL/uL (ref 3.87–5.11)
RDW: 13.8 % (ref 11.5–15.5)
WBC: 5.8 10*3/uL (ref 4.0–10.5)

## 2014-04-13 LAB — POCT URINALYSIS DIPSTICK
Bilirubin, UA: NEGATIVE
Blood, UA: NEGATIVE
Glucose, UA: NEGATIVE
Ketones, UA: NEGATIVE
Leukocytes, UA: NEGATIVE
Nitrite, UA: NEGATIVE
Protein, UA: NEGATIVE
Spec Grav, UA: 1.02
Urobilinogen, UA: NEGATIVE
pH, UA: 6.5

## 2014-04-13 LAB — HEMOCCULT GUIAC POC 1CARD (OFFICE): Fecal Occult Blood, POC: NEGATIVE

## 2014-04-13 LAB — COMPLETE METABOLIC PANEL WITH GFR
ALT: 35 U/L (ref 0–35)
AST: 44 U/L — ABNORMAL HIGH (ref 0–37)
Albumin: 4.1 g/dL (ref 3.5–5.2)
Alkaline Phosphatase: 52 U/L (ref 39–117)
BUN: 18 mg/dL (ref 6–23)
CO2: 19 mEq/L (ref 19–32)
Calcium: 9.3 mg/dL (ref 8.4–10.5)
Chloride: 104 mEq/L (ref 96–112)
Creat: 0.78 mg/dL (ref 0.50–1.10)
GFR, Est African American: >89 mL/min
GFR, Est Non African American: 86 mL/min
Glucose, Bld: 75 mg/dL (ref 70–99)
Potassium: 4.6 mEq/L (ref 3.5–5.3)
Sodium: 141 mEq/L (ref 135–145)
Total Bilirubin: 0.5 mg/dL (ref 0.2–1.2)
Total Protein: 6.3 g/dL (ref 6.0–8.3)

## 2014-04-13 LAB — TSH: TSH: 0.724 u[IU]/mL (ref 0.350–4.500)

## 2014-04-13 NOTE — Patient Instructions (Signed)
Pt to schedule her DEXA   Will set up for diagnostic  MM    See me as needed

## 2014-04-13 NOTE — Progress Notes (Addendum)
Subjective:    Patient ID: Claudia Rivera, female    DOB: 18-Aug-1955, 58 y.o.   MRN: [id number]  HPI Prior visit  Health Maintenance: See scanned HM sheet. Pap today She is due for mm in September and Dexa in December. Will get labs today U/A normal  Migraine continue maxalt prn  Menopause  Underweight BMI 16.4 [name] has gained 7 lbs since her last visit. DASH diet given.   Today  [name] is here for Wellness exam  HM: she is due for mm,  She declines Zostavax ,  UTD with colonoscopy ,  She smoked less than 1 ppd 15 years ago but uses Nicorette gum for the past 15 years.  Underwewight  Weight has been stable over the past 12 months.  Appetite good.   History of elevated lfts   lfts normalized.    Osteopenia  She gets her DExa's at her spouse's office  She is due this year     Allergies  Allergen Reactions  . Codeine Hives and Itching   Past Medical History  Diagnosis Date  . Migraine     Dr. Sandria Manly  . Menopause    Past Surgical History  Procedure Laterality Date  . Breast enhancement surgery  2012  . Dermoid cyst  excision  1980  . Neck surgery  2007    fusion C6/7  . Eye surgery     History   Social History  . Marital Status: Married    Spouse Name: N/A    Number of Children: N/A  . Years of Education: N/A   Occupational History  . Not on file.   Social History Main Topics  . Smoking status: Never Smoker   . Smokeless tobacco: Never Used  . Alcohol Use: 1.5 oz/week    3 drink(s) per week  . Drug Use: No  . Sexual Activity: Yes    Birth Control/ Protection: Post-menopausal   Other Topics Concern  . Not on file   Social History Narrative  . No narrative on file   Family History  Problem Relation Age of Onset  . COPD Mother   . Cancer Father     prostate   Patient Active Problem List   Diagnosis Date Noted  . Right flank pain 08/15/2013  . OAB (overactive bladder) 08/15/2013  . Osteopenia 09/12/2011  . Underweight 03/19/2011  . Migraine   .  Menopause    Current Outpatient Prescriptions on File Prior to Visit  Medication Sig Dispense Refill  . escitalopram (LEXAPRO) 10 MG tablet TAKE 1 TABLET (10 MG TOTAL) BY MOUTH DAILY.  90 tablet  3  . rizatriptan (MAXALT) 10 MG tablet TAKE 1 TABLET BY MOUTH AS NEEDED FOR MIGRAINE  30 tablet  5   No current facility-administered medications on file prior to visit.       Review of Systems  Respiratory: Negative for chest tightness, shortness of breath and wheezing.   Cardiovascular: Negative for chest pain, palpitations and leg swelling.  Gastrointestinal: Negative for abdominal pain.       Objective:   Physical Exam  Physical Exam  Nursing note and vitals reviewed.  Constitutional: She is oriented to person, place, and time. She appears well-developed and well-nourished.  HENT:  Head: Normocephalic and atraumatic.  Right Ear: Tympanic membrane and ear canal normal. No drainage. Tympanic membrane is not injected and not erythematous.  Left Ear: Tympanic membrane and ear canal normal. No drainage. Tympanic membrane is not injected and not erythematous.  Nose: Nose normal. Right sinus exhibits no maxillary sinus tenderness and no frontal sinus tenderness. Left sinus exhibits no maxillary sinus tenderness and no frontal sinus tenderness.  Mouth/Throat: Oropharynx is clear and moist. No oral lesions. No oropharyngeal exudate.  Eyes: Conjunctivae and EOM are normal. Pupils are equal, round, and reactive to light.  Neck: Normal range of motion. Neck supple. No JVD present. Carotid bruit is not present. No mass and no thyromegaly present.  Cardiovascular: Normal rate, regular rhythm, S1 normal, S2 normal and intact distal pulses. Exam reveals no gallop and no friction rub.  No murmur heard.  Pulses:  Carotid pulses are 2+ on the right side, and 2+ on the left side.  Dorsalis pedis pulses are 2+ on the right side, and 2+ on the left side.  No carotid bruit. No LE edema  Pulmonary/Chest:  Breath sounds normal. She has no wheezes. She has no rales. She exhibits no tenderness.  Breast bilateral implants   Right breast no discrete mass no nipple discharge no axillary adenopathy.  Left breast less than 1 cm  Superficial easily moveable mass 9 o'clock.    Abdominal: Soft. Bowel sounds are normal. She exhibits no distension and no mass. There is no hepatosplenomegaly. There is no tenderness. There is no CVA tenderness.  Musculoskeletal: Normal range of motion.  No active synovitis to joints.  Lymphadenopathy:  She has no cervical adenopathy.  She has no axillary adenopathy.  Right: No inguinal and no supraclavicular adenopathy present.  Left: No inguinal and no supraclavicular adenopathy present.  Neurological: She is alert and oriented to person, place, and time. She has normal strength and normal reflexes. She displays no tremor. No cranial nerve deficit or sensory deficit. Coordination and gait normal.  Skin: Skin is warm and dry. No rash noted. No cyanosis. Nails show no clubbing.  Psychiatric: She has a normal mood and affect. Her speech is normal and behavior is normal. Cognition and memory are normal.             Assessment & Plan:   HM:  Declines  Zostavax,  counseleed lung cancer screening guideline pt does not meet criteria,   Flu vaccine today .  Will schedule mm  R breast superficial mass:  May be related to capsule from  Implant.  Was seen by plastic surgeon who placed implants  Willl get diagnostic mm  Osteopenia with low BMI will I advised a Dexa .  Pt wishes to schedule this herself at her spouses office.   Low BMI  Pt weight has been stable for  This past year.   Migraine    Well controlled  See me as needed  Addendum  Superficial mass is in left breast  .  Archie Pattenonya spoke with imaging tech and they are to be sure to do diagnostic mm with ultrasound on the left breast       Assessment & Plan:

## 2014-04-14 ENCOUNTER — Encounter: Payer: Self-pay | Admitting: Internal Medicine

## 2014-04-14 ENCOUNTER — Encounter: Payer: Self-pay | Admitting: *Deleted

## 2014-04-14 LAB — VITAMIN D 25 HYDROXY (VIT D DEFICIENCY, FRACTURES): Vit D, 25-Hydroxy: 47 ng/mL (ref 30–89)

## 2014-04-18 ENCOUNTER — Other Ambulatory Visit: Payer: Self-pay | Admitting: *Deleted

## 2014-04-18 DIAGNOSIS — Z1231 Encounter for screening mammogram for malignant neoplasm of breast: Secondary | ICD-10-CM

## 2014-04-19 ENCOUNTER — Other Ambulatory Visit: Payer: Self-pay | Admitting: Internal Medicine

## 2014-04-25 ENCOUNTER — Ambulatory Visit
Admission: RE | Admit: 2014-04-25 | Discharge: 2014-04-25 | Disposition: A | Discharge status: Home / Self Care | Payer: BC Managed Care – PPO | Source: Ambulatory Visit | Attending: Internal Medicine | Admitting: Internal Medicine

## 2014-04-25 ENCOUNTER — Other Ambulatory Visit: Payer: Self-pay | Admitting: Internal Medicine

## 2014-04-25 DIAGNOSIS — N63 Unspecified lump in unspecified breast: Secondary | ICD-10-CM

## 2014-04-25 NOTE — Telephone Encounter (Signed)
Refill request

## 2014-04-26 ENCOUNTER — Encounter: Payer: Self-pay | Admitting: Internal Medicine

## 2014-04-26 NOTE — Progress Notes (Signed)
I left Claudia BattenKim a message to call back-eh

## 2014-05-16 ENCOUNTER — Encounter: Payer: Self-pay | Admitting: Internal Medicine

## 2014-05-25 ENCOUNTER — Other Ambulatory Visit: Payer: Self-pay | Admitting: Internal Medicine

## 2014-05-25 DIAGNOSIS — Z1231 Encounter for screening mammogram for malignant neoplasm of breast: Secondary | ICD-10-CM

## 2014-05-26 ENCOUNTER — Ambulatory Visit
Admission: RE | Admit: 2014-05-26 | Discharge: 2014-05-26 | Disposition: A | Discharge status: Home / Self Care | Payer: BC Managed Care – PPO | Source: Ambulatory Visit | Attending: Internal Medicine | Admitting: Internal Medicine

## 2014-05-26 DIAGNOSIS — Z1231 Encounter for screening mammogram for malignant neoplasm of breast: Secondary | ICD-10-CM

## 2014-09-12 ENCOUNTER — Encounter: Payer: BC Managed Care – PPO | Admitting: Internal Medicine

## 2014-10-31 ENCOUNTER — Encounter: Payer: Self-pay | Admitting: *Deleted

## 2015-05-01 ENCOUNTER — Other Ambulatory Visit: Payer: Self-pay

## 2015-05-01 DIAGNOSIS — Z9882 Breast implant status: Secondary | ICD-10-CM

## 2015-05-01 DIAGNOSIS — Z1231 Encounter for screening mammogram for malignant neoplasm of breast: Secondary | ICD-10-CM

## 2015-06-05 ENCOUNTER — Ambulatory Visit
Admission: RE | Admit: 2015-06-05 | Discharge: 2015-06-05 | Disposition: A | Discharge status: Home / Self Care | Payer: BLUE CROSS/BLUE SHIELD | Source: Ambulatory Visit

## 2015-06-05 DIAGNOSIS — Z1231 Encounter for screening mammogram for malignant neoplasm of breast: Secondary | ICD-10-CM

## 2015-06-05 DIAGNOSIS — Z9882 Breast implant status: Secondary | ICD-10-CM

## 2018-12-21 ENCOUNTER — Ambulatory Visit
Admission: RE | Admit: 2018-12-21 | Discharge: 2018-12-21 | Disposition: A | Discharge status: Home / Self Care | Payer: No Typology Code available for payment source | Source: Ambulatory Visit | Attending: Sports Medicine | Admitting: Sports Medicine

## 2018-12-21 ENCOUNTER — Other Ambulatory Visit: Payer: Self-pay | Admitting: Sports Medicine

## 2018-12-21 DIAGNOSIS — R109 Unspecified abdominal pain: Secondary | ICD-10-CM

## 2019-02-22 ENCOUNTER — Other Ambulatory Visit: Payer: Self-pay | Admitting: Orthopedic Surgery

## 2019-02-22 LAB — SARS CORONAVIRUS 2 (TAT 6-24 HRS): SARS Coronavirus 2: NEGATIVE

## 2019-03-02 ENCOUNTER — Telehealth: Payer: Self-pay | Admitting: Interventional Cardiology

## 2019-03-02 NOTE — Telephone Encounter (Signed)

## 2019-03-03 ENCOUNTER — Other Ambulatory Visit: Payer: Self-pay

## 2019-03-03 ENCOUNTER — Encounter: Payer: Self-pay | Admitting: Interventional Cardiology

## 2019-03-03 ENCOUNTER — Ambulatory Visit (INDEPENDENT_AMBULATORY_CARE_PROVIDER_SITE_OTHER): Payer: No Typology Code available for payment source | Admitting: Interventional Cardiology

## 2019-03-03 VITALS — BP 124/94 | HR 67 | Ht 66.5 in | Wt 112.0 lb

## 2019-03-03 DIAGNOSIS — R030 Elevated blood-pressure reading, without diagnosis of hypertension: Secondary | ICD-10-CM

## 2019-03-03 DIAGNOSIS — R0609 Other forms of dyspnea: Secondary | ICD-10-CM | POA: Diagnosis not present

## 2019-03-03 DIAGNOSIS — R9431 Abnormal electrocardiogram [ECG] [EKG]: Secondary | ICD-10-CM | POA: Diagnosis not present

## 2019-03-03 DIAGNOSIS — I700 Atherosclerosis of aorta: Secondary | ICD-10-CM

## 2019-03-03 DIAGNOSIS — Z7189 Other specified counseling: Secondary | ICD-10-CM

## 2019-03-03 DIAGNOSIS — R0602 Shortness of breath: Secondary | ICD-10-CM | POA: Diagnosis not present

## 2019-03-03 DIAGNOSIS — R0600 Dyspnea, unspecified: Secondary | ICD-10-CM

## 2019-03-03 MED ORDER — METOPROLOL TARTRATE 100 MG PO TABS: ORAL_TABLET | ORAL | 0 refills | Status: DC

## 2019-03-03 NOTE — Patient Instructions (Addendum)
Medication Instructions:  Your physician recommends that you continue on your current medications as directed. Please refer to the Current Medication list given to you today.  If you need a refill on your cardiac medications before your next appointment, please call your pharmacy.   Lab work: You will need a BMET prior to your CT.  If you have labs (blood work) drawn today and your tests are completely normal, you will receive your results only by: Marland Kitchen MyChart Message (if you have MyChart) OR . A paper copy in the mail If you have any lab test that is abnormal or we need to change your treatment, we will call you to review the results.  Testing/Procedures: Your physician has requested that you have an echocardiogram. Echocardiography is a painless test that uses sound waves to create images of your heart. It provides your doctor with information about the size and shape of your heart and how well your heart's chambers and valves are working. This procedure takes approximately one hour. There are no restrictions for this procedure.  Your physician recommends that you have a Coronary CT performed.   Follow-Up: Your physician recommends that you schedule a follow-up appointment as needed with Dr. Tamala Julian.    Any Other Special Instructions Will Be Listed Below (If Applicable).   Your cardiac CT will be scheduled at one of the below locations:   [facility] [address] [place], [place] [address] [phone] [address] [place], [place] [address] [phone]  Please arrive at the [facility] main entrance of [facility] 30-45 minutes prior to test start time. Proceed to the [facility] Radiology Department (first floor) to check-in and test prep.  Please follow these instructions carefully (unless otherwise directed):  Hold all erectile dysfunction medications at least 48 hours prior to  test.  On the Night Before the Test: . Be sure to Drink plenty of water. . Do not consume any caffeinated/decaffeinated beverages or chocolate 12 hours prior to your test. . Do not take any antihistamines 12 hours prior to your test. . If you take Metformin do not take 24 hours prior to test.  On the Day of the Test: . Drink plenty of water. Do not drink any water within one hour of the test. . Do not eat any food 4 hours prior to the test. . You may take your regular medications prior to the test.  . Take metoprolol (Lopressor) two hours prior to test. . HOLD Furosemide/Hydrochlorothiazide morning of the test. . FEMALES- please wear underwire-free bra if available       After the Test: . Drink plenty of water. . After receiving IV contrast, you may experience a mild flushed feeling. This is normal. . On occasion, you may experience a mild rash up to 24 hours after the test. This is not dangerous. If this occurs, you can take Benadryl 25 mg and increase your fluid intake. . If you experience trouble breathing, this can be serious. If it is severe call 911 IMMEDIATELY. If it is mild, please call our office. . If you take any of these medications: Glipizide/Metformin, Avandament, Glucavance, please do not take 48 hours after completing test.    Please contact the cardiac imaging nurse navigator should you have any questions/concerns Marchia Bond, RN Navigator [facility] [phone] Office  [phone] Cell

## 2019-03-03 NOTE — Progress Notes (Signed)
Cardiology Office Note:    Date:  03/03/2019   ID:  Claudia Rivera, DOB [birth year], MRN [id number]  PCP:  Marcelle OverlieGrewal, Michelle, MD  Cardiologist:  No primary care provider on file.   Referring MD: Marcelle OverlieGrewal, Michelle, MD   Chief Complaint  Patient presents with  . Shortness of Breath  . Abnormal ECG    History of Present Illness:    Claudia Rivera is a 63 y.o. female with a hx of ADHD, abdominal atherosclerosis (CT scan 2020), elevated blood pressure without diagnosis of hypertension, and recent exertional fatigue and dyspnea at rest and with activity.  She is referred by her husband, Dr. Salvatore Marvelobert Roeder.  [name] has no antecedent history of coronary disease.  No prior history of heart disease.  She does have EKG abnormalities noted as far back as 2012 when she had interventricular conduction delay with leftward axis.  A recent abdominal CT scan to evaluate urinary frequency and other abdominal complaints revealed abdominal atherosclerosis.  Over the past 2 weeks she has had the sensation of decreased energy, breathlessness both with activity and at rest, puffiness in her hands, and headache.  Dr. Wyline MoodWeiner asked that I see her to consider whether there is a cardiac issue going on at this time.  She appears healthy and is in no obvious distress.  She ordinarily plays golf with her husband but stopped after 2 holes yesterday because of fatigue and breathlessness.  She is not having orthopnea, lower extremity swelling, significant palpitations, dizziness, or chest discomfort.  Past Medical History:  Diagnosis Date  . Menopause   . Migraine    Dr. Sandria ManlyLove    Past Surgical History:  Procedure Laterality Date  . BREAST ENHANCEMENT SURGERY  2012  . DERMOID CYST  EXCISION  1980  . EYE SURGERY    . NECK SURGERY  2007   fusion C6/7    Current Medications: Current Meds  Medication Sig  . Lactobacillus (PROBIOTIC ACIDOPHILUS PO) Take 1 capsule by mouth daily.  Marland Kitchen. LEXAPRO 10 MG tablet TAKE 1 TABLET (10 MG  TOTAL) BY MOUTH DAILY.  Marland Kitchen. lisdexamfetamine (VYVANSE) 30 MG capsule Take 30 mg by mouth daily.  . rizatriptan (MAXALT) 10 MG tablet TAKE 1 TABLET BY MOUTH AS NEEDED FOR MIGRAINE     Allergies:   Codeine   Social History   Socioeconomic History  . Marital status: Married    Spouse name: Not on file  . Number of children: Not on file  . Years of education: Not on file  . Highest education level: Not on file  Occupational History  . Not on file  Social Needs  . Financial resource strain: Not on file  . Food insecurity    Worry: Not on file    Inability: Not on file  . Transportation needs    Medical: Not on file    Non-medical: Not on file  Tobacco Use  . Smoking status: Never Smoker  . Smokeless tobacco: Never Used  Substance and Sexual Activity  . Alcohol use: Yes    Alcohol/week: 3.0 standard drinks    Types: 3 drink(s) per week  . Drug use: No  . Sexual activity: Yes    Birth control/protection: Post-menopausal  Lifestyle  . Physical activity    Days per week: Not on file    Minutes per session: Not on file  . Stress: Not on file  Relationships  . Social connections    Talks on phone: Not on file  Gets together: Not on file    Attends religious service: Not on file    Active member of club or organization: Not on file    Attends meetings of clubs or organizations: Not on file    Relationship status: Not on file  Other Topics Concern  . Not on file  Social History Narrative  . Not on file     Family History: The patient's family history includes COPD in her mother; Cancer in her father.  Mother died at age 61 from complications of lymphoma and COPD.  Father died in his 6090s and had a pacemaker.  A sister has hypertension.  ROS:   Please see the history of present illness.    She has a history of abdominal discomfort and frequent urination that is being worked up.  No significant chronic medical problems other than ADD.  She is not on any supplemental  therapies.  All other systems reviewed and are negative.  EKGs/Labs/Other Studies Reviewed:    The following studies were reviewed today: ABDOMINAL CT 12/2018: IMPRESSION: 1. No acute findings in the abdomen or pelvis. 2. No nephrolithiasis or ureterolithiasis. 3. Atherosclerotic calcification of the abdominal aorta.  EKG:  EKG normal sinus rhythm, left anterior hemiblock, poor R wave progression V1 through V4.  When compared to the prior tracing from September 2012, left axis deviation is more significant in the diagnosis of left anterior hemiblock is now made.  Recent Labs: No results found for requested labs within last 8760 hours.  Recent Lipid Panel    Component Value Date/Time   CHOL 210 (H) 04/11/2014 0940   TRIG 55 04/11/2014 0940   HDL 104 04/11/2014 0940   CHOLHDL 2.0 04/11/2014 0940   VLDL 11 04/11/2014 0940   LDLCALC 95 04/11/2014 0940    Physical Exam:    VS:  BP (!) 124/94   Pulse 67   Ht 5' 6.5" (1.689 m)   Wt 112 lb (50.8 kg)   SpO2 98%   BMI 17.81 kg/m     Wt Readings from Last 3 Encounters:  03/03/19 112 lb (50.8 kg)  04/13/14 105 lb (47.6 kg)  01/21/13 105 lb (47.6 kg)     GEN: Slender1. No acute distress HEENT: Normal NECK: No JVD. LYMPHATICS: No lymphadenopathy CARDIAC:  RRR without murmur, gallop, or edema. VASCULAR:  Normal Pulses. No bruits. RESPIRATORY:  Clear to auscultation without rales, wheezing or rhonchi  ABDOMEN: Soft, non-tender, non-distended, No pulsatile mass, MUSCULOSKELETAL: No deformity  SKIN: Warm and dry NEUROLOGIC:  Alert and oriented x 3 PSYCHIATRIC:  Normal affect   ASSESSMENT:    1. DOE (dyspnea on exertion)   2. Abnormal electrocardiogram   3. Shortness of breath   4. Abdominal aortic atherosclerosis (HCC)   5. Elevated blood-pressure reading without diagnosis of hypertension   6. Educated About Covid-19 Virus Infection    PLAN:    In order of problems listed above:  1. 2D Doppler echocardiogram to assess  LV size and function and also evaluate RV to exclude the possibility of pulmonary hypertension as a contributor. 2. 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  This is unchanged from an EKG years ago. 3. She feels breathless even at rest.  There are no features to suggest PE.  O2 saturation is 98%. 4. Primary/secondary prevention needs to be instituted.  A lipid panel is pending from her primary physician.  Blood pressure needs to be followed closely and if continues in the mildly  elevated current range, therapy will need to be started. 5. See dialogue above under #4. 6. Social distancing, mask wearing, and handwashing were stressed.  Overall education and awareness concerning primary/secondary risk prevention was discussed in detail: LDL less than 70, hemoglobin A1c less than 7, blood pressure target less than 130/80 mmHg, >150 minutes of moderate aerobic activity per week, avoidance of smoking, weight control (via diet and exercise), and continued surveillance/management of/for obstructive sleep apnea.    Medication Adjustments/Labs and Tests Ordered: Current medicines are reviewed at length with the patient today.  Concerns regarding medicines are outlined above.  Orders Placed This Encounter  Procedures  . CT CORONARY MORPH W/CTA COR W/SCORE W/CA W/CM &/OR WO/CM  . CT CORONARY FRACTIONAL FLOW RESERVE DATA PREP  . CT CORONARY FRACTIONAL FLOW RESERVE FLUID ANALYSIS  . Basic metabolic panel  . EKG 12-Lead  . ECHOCARDIOGRAM COMPLETE   Meds ordered this encounter  Medications  . metoprolol tartrate (LOPRESSOR) 100 MG tablet    Sig: Take one tablet by mouth 2 hours prior to your CT    Dispense:  1 tablet    Refill:  0    Patient Instructions  Medication Instructions:  Your physician recommends that you continue on your current medications as directed. Please refer to the Current Medication list given to you today.  If you need a refill on your  cardiac medications before your next appointment, please call your pharmacy.   Lab work: You will need a BMET prior to your CT.  If you have labs (blood work) drawn today and your tests are completely normal, you will receive your results only by: Marland Kitchen MyChart Message (if you have MyChart) OR . A paper copy in the mail If you have any lab test that is abnormal or we need to change your treatment, we will call you to review the results.  Testing/Procedures: Your physician has requested that you have an echocardiogram. Echocardiography is a painless test that uses sound waves to create images of your heart. It provides your doctor with information about the size and shape of your heart and how well your heart's chambers and valves are working. This procedure takes approximately one hour. There are no restrictions for this procedure.  Your physician recommends that you have a Coronary CT performed.   Follow-Up: Your physician recommends that you schedule a follow-up appointment as needed with Dr. Tamala Julian.    Any Other Special Instructions Will Be Listed Below (If Applicable).   Your cardiac CT will be scheduled at one of the below locations:   [facility] [address] [place], [place] [address] (336) [place] [address] [place], [place] [address] [phone]  Please arrive at the [facility] main entrance of [facility] 30-45 minutes prior to test start time. Proceed to the [facility] Radiology Department (first floor) to check-in and test prep.  Please follow these instructions carefully (unless otherwise directed):  Hold all erectile dysfunction medications at least 48 hours prior to test.  On the Night Before the Test: . Be sure to Drink plenty of water. . Do not consume any caffeinated/decaffeinated beverages or chocolate 12 hours prior to your test. . Do not take any antihistamines 12  hours prior to your test. . If you take Metformin do not take 24 hours prior to test.  On the Day of the Test: . Drink plenty of water. Do not drink any water  within one hour of the test. . Do not eat any food 4 hours prior to the test. . You may take your regular medications prior to the test.  . Take metoprolol (Lopressor) two hours prior to test. . HOLD Furosemide/Hydrochlorothiazide morning of the test. . FEMALES- please wear underwire-free bra if available       After the Test: . Drink plenty of water. . After receiving IV contrast, you may experience a mild flushed feeling. This is normal. . On occasion, you may experience a mild rash up to 24 hours after the test. This is not dangerous. If this occurs, you can take Benadryl 25 mg and increase your fluid intake. . If you experience trouble breathing, this can be serious. If it is severe call 911 IMMEDIATELY. If it is mild, please call our office. . If you take any of these medications: Glipizide/Metformin, Avandament, Glucavance, please do not take 48 hours after completing test.    Please contact the cardiac imaging nurse navigator should you have any questions/concerns Rockwell AlexandriaSara Wallace, RN Navigator Cardiac Imaging [facility] [phone] Office  [phone] Cell      Signed, Lesleigh NoeHenry W Smith III, MD  03/03/2019 9:13 AM    [facility]

## 2019-03-08 ENCOUNTER — Other Ambulatory Visit: Payer: Self-pay

## 2019-03-08 ENCOUNTER — Ambulatory Visit (HOSPITAL_COMMUNITY): Payer: No Typology Code available for payment source | Attending: Internal Medicine

## 2019-03-08 DIAGNOSIS — R0609 Other forms of dyspnea: Secondary | ICD-10-CM | POA: Diagnosis present

## 2019-03-08 DIAGNOSIS — R0602 Shortness of breath: Secondary | ICD-10-CM | POA: Insufficient documentation

## 2019-03-08 DIAGNOSIS — R0600 Dyspnea, unspecified: Secondary | ICD-10-CM

## 2019-03-08 DIAGNOSIS — R9431 Abnormal electrocardiogram [ECG] [EKG]: Secondary | ICD-10-CM | POA: Diagnosis not present

## 2019-03-10 ENCOUNTER — Telehealth: Payer: Self-pay | Admitting: *Deleted

## 2019-03-10 MED ORDER — LOSARTAN POTASSIUM 25 MG PO TABS: 25.0000 mg | ORAL_TABLET | Freq: Every day | ORAL | 3 refills | Status: DC

## 2019-03-10 NOTE — Telephone Encounter (Signed)
Informed pt of results. Pt verbalized understanding. 

## 2019-03-10 NOTE — Telephone Encounter (Signed)
-----   Message from Belva Crome, MD sent at 03/09/2019  3:38 PM EDT ----- Let the patient know the labs have been reviewed. Cholesterol is high. It may need treatment but will wait to see results of CT scan. Secondly, ECHO shows heart is strong but is stiff (we call if diastolic dysfunction). This could contribute to dyspnea. Start Losartan 25 mg daily and monitor BP daily at least 2 hours after AM dose. This is a very low dose and may need upward titration.We will see. A copy will be sent to Dian Queen, MD

## 2019-03-11 ENCOUNTER — Telehealth (HOSPITAL_COMMUNITY): Payer: Self-pay | Admitting: Emergency Medicine

## 2019-03-11 ENCOUNTER — Encounter (HOSPITAL_COMMUNITY): Payer: Self-pay | Admitting: Emergency Medicine

## 2019-03-11 NOTE — Telephone Encounter (Signed)
Left message on voicemail with name and callback number Marchia Bond RN Navigator Cardiac [facility] [phone] Office [phone] CellLeft message on voicemail with name and callback number Marchia Bond RN Navigator Cardiac Imaging [facility] [phone] Office [phone] Cell

## 2019-03-12 ENCOUNTER — Ambulatory Visit (HOSPITAL_COMMUNITY)
Admission: RE | Admit: 2019-03-12 | Discharge: 2019-03-12 | Disposition: A | Discharge status: Home / Self Care | Payer: PRIVATE HEALTH INSURANCE | Source: Ambulatory Visit | Attending: Interventional Cardiology | Admitting: Interventional Cardiology

## 2019-03-12 ENCOUNTER — Other Ambulatory Visit: Payer: Self-pay

## 2019-03-12 ENCOUNTER — Ambulatory Visit (HOSPITAL_COMMUNITY): Admission: RE | Admit: 2019-03-12 | Payer: PRIVATE HEALTH INSURANCE | Source: Ambulatory Visit

## 2019-03-12 DIAGNOSIS — R0602 Shortness of breath: Secondary | ICD-10-CM | POA: Diagnosis present

## 2019-03-12 DIAGNOSIS — R9431 Abnormal electrocardiogram [ECG] [EKG]: Secondary | ICD-10-CM | POA: Diagnosis not present

## 2019-03-12 DIAGNOSIS — R0609 Other forms of dyspnea: Secondary | ICD-10-CM

## 2019-03-12 DIAGNOSIS — R0600 Dyspnea, unspecified: Secondary | ICD-10-CM

## 2019-03-12 MED ORDER — NITROGLYCERIN 0.4 MG SL SUBL
0.4000 mg | SUBLINGUAL_TABLET | Freq: Once | SUBLINGUAL | Status: AC
  Administered 2019-03-12: 14:00:00 0.4 mg via SUBLINGUAL
  Filled 2019-03-12: qty 25

## 2019-03-12 MED ORDER — NITROGLYCERIN 0.4 MG SL SUBL
SUBLINGUAL_TABLET | SUBLINGUAL | Status: AC
  Filled 2019-03-12: qty 1

## 2019-03-12 MED ORDER — IOHEXOL 350 MG/ML SOLN
100.0000 mL | Freq: Once | INTRAVENOUS | Status: AC | PRN
  Administered 2019-03-12: 100 mL via INTRAVENOUS

## 2019-11-09 ENCOUNTER — Ambulatory Visit (INDEPENDENT_AMBULATORY_CARE_PROVIDER_SITE_OTHER): Payer: Self-pay | Admitting: Plastic Surgery

## 2019-11-09 ENCOUNTER — Encounter: Payer: Self-pay | Admitting: Plastic Surgery

## 2019-11-09 ENCOUNTER — Other Ambulatory Visit: Payer: Self-pay

## 2019-11-09 DIAGNOSIS — Z719 Counseling, unspecified: Secondary | ICD-10-CM

## 2019-11-09 NOTE — Progress Notes (Signed)
Patient ID: Claudia Rivera, female    DOB: 10/22/1955, 64 y.o.   MRN: [id number]   Chief Complaint  Patient presents with  . Advice Only    for implant issue  . Breast Problem    The patient is a 64 year old white female evaluation of her breasts.  The patient underwent breast augmentation by Dr. Shon Hough 30 years ago.  According to the records we have available today by Dr. Mauri Reading , the original implants were 220 cc saline implants that were filled to 300 cc of saline.  05/09/94 Dr. Mauri Reading in [place] placement of failed Mentor [phone]) 375 cc saline placed. The record also indicates that one month later (05/17/94) she underwent revision with exchange of implants she had saline filled Mentor 525 [phone]) implants placed with 575 cc of saline. That she had. She had some hypertrophic scarring letter contracture. In 2012 the patient saw Dr. Stephens November for a revision. The implants were were exchanged for Mentor memory gel round moderate profile 555 cc implants. This implant has a width of 15.7 cm. In the past few years she has noted irregularities, none of the implant and bulging. She is due for her mammogram in May. She has not had any issues in the past with a regular mammogram. She is 5 feet 7 inches tall and weighs 110 pounds   Review of Systems  Constitutional: Negative.  Negative for activity change and appetite change.  HENT: Negative.   Eyes: Negative.   Respiratory: Negative.  Negative for chest tightness.   Cardiovascular: Positive for chest pain. Negative for leg swelling.  Gastrointestinal: Negative.  Negative for abdominal distention.  Endocrine: Negative.   Genitourinary: Negative.   Musculoskeletal: Negative.   Hematological: Negative.     Past Medical History:  Diagnosis Date  . Menopause   . Migraine    Dr. Sandria Manly    Past Surgical History:  Procedure Laterality Date  . BREAST ENHANCEMENT SURGERY  2012  . DERMOID CYST  EXCISION  1980  . EYE SURGERY    . NECK  SURGERY  2007   fusion C6/7      Current Outpatient Medications:  .  Lactobacillus (PROBIOTIC ACIDOPHILUS PO), Take 1 capsule by mouth daily., Disp: , Rfl:  .  LEXAPRO 10 MG tablet, TAKE 1 TABLET (10 MG TOTAL) BY MOUTH DAILY., Disp: 90 tablet, Rfl: 3 .  lisdexamfetamine (VYVANSE) 30 MG capsule, Take 30 mg by mouth daily., Disp: , Rfl:  .  losartan (COZAAR) 25 MG tablet, Take 1 tablet (25 mg total) by mouth daily., Disp: 90 tablet, Rfl: 3 .  metoprolol tartrate (LOPRESSOR) 100 MG tablet, Take one tablet by mouth 2 hours prior to your CT, Disp: 1 tablet, Rfl: 0 .  rizatriptan (MAXALT) 10 MG tablet, TAKE 1 TABLET BY MOUTH AS NEEDED FOR MIGRAINE, Disp: 30 tablet, Rfl: 5   Objective:   Vitals:   11/09/19 0943  BP: (!) 144/86  Pulse: 72  Temp: (!) 97.5 F (36.4 C)  SpO2: 93%    Physical Exam Vitals and nursing note reviewed.  Constitutional:      Appearance: Normal appearance.  HENT:     Head: Normocephalic and atraumatic.  Cardiovascular:     Rate and Rhythm: Normal rate.     Pulses: Normal pulses.  Pulmonary:     Effort: Pulmonary effort is normal.  Abdominal:     General: Abdomen is flat. There is no distension.  Skin:    General: Skin is warm.  Capillary Refill: Capillary refill takes less than 2 seconds.  Neurological:     General: No focal deficit present.     Mental Status: She is alert and oriented to person, place, and time.  Psychiatric:        Mood and Affect: Mood normal.        Behavior: Behavior normal.     Assessment & Plan:  Encounter for counseling  Recommend removal of the current implants.  Will likely need ADM with new ultra high profile, smaller width and possibly smaller implants. She would like to spend an overnight.  She is going to talk with her husband and let us know.  We will provide her a quote.  She is at risk of complications due to her 3 previous surgeries.  Pictures were obtained of the patient and placed in the chart with the  patient's or guardian's permission.  Sharon, DO

## 2019-12-01 NOTE — Progress Notes (Addendum)
CARDIOLOGY OFFICE NOTE  Date:  12/06/2019    Claudia Rivera Date of Birth: April 19, 1956 Medical Record #[id number]  PCP:  [name], MD  Cardiologist:  Katrinka Blazing  Chief Complaint  Patient presents with  . Follow-up    History of Present Illness: Claudia Rivera is a 64 y.o. female who presents today for a follow up visit. Seen for Dr. Katrinka Blazing.   She has a history of ADHD, abdominal atherosclerosis (CT scan 2020 done for urinary frequency), elevated blood pressure without diagnosis of hypertension, and prior exertional fatigue and dyspnea at rest and with activity.  She was referred by her husband, Dr. Salvatore Marvel to Dr. Katrinka Blazing. There is no history of CAD but has chronically abnormal EKG since 2012 with IVCD with leftward axis.   Originally and last seen in August of 2020 by Dr. Katrinka Blazing - noted decreased energy and DOE, swelling and headache. BP was up - ARB was added. Echo and coronary CT were arranged.   The patient does not have symptoms concerning for COVID-19 infection (fever, chills, cough, or new shortness of breath).   Comes in today. Here alone. She is doing well. No chest pain. Breathing is fine. Not dizzy. Not lightheaded. Does not really check her BP at home. Tolerating her medicines. Needs refill of the ARB. Overall no real concerns. No recent labs noted.   Past Medical History:  Diagnosis Date  . Menopause   . Migraine    Dr. Sandria Manly    Past Surgical History:  Procedure Laterality Date  . BREAST ENHANCEMENT SURGERY  2012  . DERMOID CYST  EXCISION  1980  . EYE SURGERY    . NECK SURGERY  2007   fusion C6/7     Medications: Current Meds  Medication Sig  . Lactobacillus (PROBIOTIC ACIDOPHILUS PO) Take 1 capsule by mouth daily.  Marland Kitchen LEXAPRO 10 MG tablet TAKE 1 TABLET (10 MG TOTAL) BY MOUTH DAILY.  Marland Kitchen losartan (COZAAR) 25 MG tablet Take 1 tablet (25 mg total) by mouth daily.  . rizatriptan (MAXALT) 10 MG tablet TAKE 1 TABLET BY MOUTH AS NEEDED FOR MIGRAINE  .  VYVANSE 40 MG capsule Take 40 mg by mouth every morning.  . [DISCONTINUED] losartan (COZAAR) 25 MG tablet Take 1 tablet (25 mg total) by mouth daily.     Allergies: Allergies  Allergen Reactions  . Codeine Hives and Itching    Social History: The patient  reports that she has never smoked. She has never used smokeless tobacco. She reports current alcohol use of about 3.0 standard drinks of alcohol per week. She reports that she does not use drugs.   Family History: The patient's family history includes COPD in her mother; Cancer in her father.   Review of Systems: Please see the history of present illness.   All other systems are reviewed and negative.   Physical Exam: VS:  BP 134/82   Pulse 86   Ht 5\' 7"  (1.702 m)   Wt 111 lb 1.9 oz (50.4 kg)   SpO2 99%   BMI 17.40 kg/m  .  BMI Body mass index is 17.4 kg/m.  Wt Readings from Last 3 Encounters:  12/06/19 111 lb 1.9 oz (50.4 kg)  11/09/19 110 lb 9.6 oz (50.2 kg)  03/03/19 112 lb (50.8 kg)    General: Pleasant. Well developed, well nourished and in no acute distress.   HEENT: Normal.  Neck: Supple, no JVD, carotid bruits, or masses noted.  Cardiac: Regular  rate and rhythm. No murmurs, rubs, or gallops. No edema.  Respiratory:  Lungs are clear to auscultation bilaterally with normal work of breathing.  GI: Soft and nontender.  MS: No deformity or atrophy. Gait and ROM intact.  Skin: Warm and dry. Color is normal.  Neuro:  Strength and sensation are intact and no gross focal deficits noted.  Psych: Alert, appropriate and with normal affect.   LABORATORY DATA:  EKG:  EKG is not ordered today.    Lab Results  Component Value Date   WBC 5.8 04/11/2014   HGB 14.4 04/11/2014   HCT 41.8 04/11/2014   PLT 190 04/11/2014   GLUCOSE 75 04/11/2014   CHOL 210 (H) 04/11/2014   TRIG 55 04/11/2014   HDL 104 04/11/2014   LDLCALC 95 04/11/2014   ALT 35 04/11/2014   AST 44 (H) 04/11/2014   NA 141 04/11/2014   K 4.6  04/11/2014   CL 104 04/11/2014   CREATININE 0.78 04/11/2014   BUN 18 04/11/2014   CO2 19 04/11/2014   TSH 0.724 04/11/2014       BNP (last 3 results) No results for input(s): BNP in the last 8760 hours.  ProBNP (last 3 results) No results for input(s): PROBNP in the last 8760 hours.   Other Studies Reviewed Today:  CORONARY CT IMPRESSION 02/2019: 1. Coronary calcium score of 0. This was 0 percentile for age and sex matched control.  2. Normal coronary origin with right dominance.  3. No evidence of CAD.  4. Mild atherosclerosis of the descending aorta.  Chilton Si, MD   Electronically Signed   By: Chilton Si   On: 03/12/2019 15:52   ECHO IMPRESSIONS 02/2019  1. The left ventricle has normal systolic function, with an ejection  fraction of 55-60%. The cavity size was normal. There is mildly increased  left ventricular wall thickness. Left ventricular diastolic Doppler  parameters are consistent with impaired  relaxation. Elevated left atrial and left ventricular end-diastolic  pressures The E/e' is >15. No evidence of left ventricular regional wall  motion abnormalities.  2. The right ventricle has normal systolic function. The cavity was  normal. There is no increase in right ventricular wall thickness.  3. The mitral valve is abnormal. Mild thickening of the mitral valve  leaflet.  4. The tricuspid valve is grossly normal.  5. The aortic valve is tricuspid. No stenosis of the aortic valve.  6. The aorta is normal unless otherwise noted.  7. The average left ventricular global longitudinal strain is -15.7 %.    Let the patient know she has mild increased LV thickness. This confirms high BP present for awhile. Therapy indicated. Could be contributing to dyspnea. A copy will be sent to Marcelle Overlie, MD Per Dr. Rennis Golden 03/09/19: "the measurements are normal, but not accurate in my opinion. The LV looked thick to me and I measured it at  1.0 cm (posterior) and 1.1 cm (septal) in the parasternal long axis and short axis views. If you look at the image, the calipers are not accurately capturing the width. There is diastolic dysfunction and elevated LV filling pressure with mildly abnormal longitudinal strain - which fits with LVH. Her left atrial size is normal. I wonder if she has hypertension that has been untreated or thought to be white coat?"    ASSESSMENT & PLAN:    1. Prior DOE - this is resolved.   2. 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    3. Abdominal atherosclerosis - calcium score of 0 - recommend lifestyle measures.   4. Prior elevated BP - mild LVH noted on prior echo - BP is good here today - ARB is refilled today. BMET today as well.   She also noted during today's visit that she may be having a breast implant removed/replaced - she is getting opinions - this would be ok from our standpoint.   5. COVID-19 Education: The signs and symptoms of COVID-19 were discussed with the patient and how to seek care for testing (follow up with PCP or arrange E-visit).  The importance of social distancing, staying at home, hand hygiene and wearing a mask when out in public were discussed today.  Current medicines are reviewed with the patient today.  The patient does not have concerns regarding medicines other than what has been noted above.  The following changes have been made:  See above.  Labs/ tests ordered today include:    Orders Placed This Encounter  Procedures  . Basic metabolic panel     Disposition:   FU with Dr. Tamala Julian in one year.    Patient is agreeable to this plan and will call if any problems develop in the interim.   SignedTruitt Merle, NP  12/06/2019 8:28 AM  [facility] [address] [place], [place]  [address] Phone: [phone] Fax: [phone]

## 2019-12-06 ENCOUNTER — Other Ambulatory Visit: Payer: Self-pay | Admitting: Obstetrics and Gynecology

## 2019-12-06 ENCOUNTER — Encounter: Payer: Self-pay | Admitting: Nurse Practitioner

## 2019-12-06 ENCOUNTER — Ambulatory Visit (INDEPENDENT_AMBULATORY_CARE_PROVIDER_SITE_OTHER): Payer: No Typology Code available for payment source | Admitting: Nurse Practitioner

## 2019-12-06 ENCOUNTER — Other Ambulatory Visit: Payer: Self-pay

## 2019-12-06 VITALS — BP 134/82 | HR 86 | Ht 67.0 in | Wt 111.1 lb

## 2019-12-06 DIAGNOSIS — T8549XA Other mechanical complication of breast prosthesis and implant, initial encounter: Secondary | ICD-10-CM

## 2019-12-06 DIAGNOSIS — R030 Elevated blood-pressure reading, without diagnosis of hypertension: Secondary | ICD-10-CM

## 2019-12-06 LAB — BASIC METABOLIC PANEL
BUN/Creatinine Ratio: 30 — ABNORMAL HIGH (ref 12–28)
BUN: 22 mg/dL (ref 8–27)
CO2: 23 mmol/L (ref 20–29)
Calcium: 9.2 mg/dL (ref 8.7–10.3)
Chloride: 101 mmol/L (ref 96–106)
Creatinine, Ser: 0.74 mg/dL (ref 0.57–1.00)
GFR calc Af Amer: 101 mL/min/{1.73_m2} (ref >59)
GFR calc non Af Amer: 88 mL/min/{1.73_m2} (ref >59)
Glucose: 87 mg/dL (ref 65–99)
Potassium: 4.5 mmol/L (ref 3.5–5.2)
Sodium: 140 mmol/L (ref 134–144)

## 2019-12-06 MED ORDER — LOSARTAN POTASSIUM 25 MG PO TABS: 25.0000 mg | ORAL_TABLET | Freq: Every day | ORAL | 3 refills | Status: AC

## 2019-12-06 NOTE — Patient Instructions (Addendum)
After Visit Summary:  We will be checking the following labs today - BMET   Medication Instructions:    Continue with your current medicines.    If you need a refill on your cardiac medications before your next appointment, please call your pharmacy.     Testing/Procedures To Be Arranged:  N/A  Follow-Up:   See Dr. Katrinka Blazing in one year -  You will receive a reminder letter in the mail two months in advance. If you don't receive a letter, please call our office to schedule the follow-up appointment.     At [facility], you and your health needs are our priority.  As part of our continuing mission to provide you with exceptional heart care, we have created designated Provider Care Teams.  These Care Teams include your primary Cardiologist (physician) and Advanced Practice Providers (APPs -  Physician Assistants and Nurse Practitioners) who all work together to provide you with the care you need, when you need it.  Special Instructions:  . Stay safe, stay home, wash your hands for at least 20 seconds and wear a mask when out in public.  . It was good to talk with you today.    Call the [facility] office at [phone] if you have any questions, problems or concerns.

## 2019-12-14 ENCOUNTER — Ambulatory Visit
Admission: RE | Admit: 2019-12-14 | Discharge: 2019-12-14 | Disposition: A | Discharge status: Home / Self Care | Payer: No Typology Code available for payment source | Source: Ambulatory Visit | Attending: Obstetrics and Gynecology | Admitting: Obstetrics and Gynecology

## 2019-12-14 ENCOUNTER — Other Ambulatory Visit: Payer: Self-pay

## 2019-12-14 DIAGNOSIS — T8549XA Other mechanical complication of breast prosthesis and implant, initial encounter: Secondary | ICD-10-CM

## 2020-01-11 ENCOUNTER — Other Ambulatory Visit: Payer: No Typology Code available for payment source

## 2020-01-26 ENCOUNTER — Other Ambulatory Visit: Payer: Self-pay

## 2020-01-26 ENCOUNTER — Ambulatory Visit (INDEPENDENT_AMBULATORY_CARE_PROVIDER_SITE_OTHER): Payer: No Typology Code available for payment source | Admitting: Ophthalmology

## 2020-01-26 ENCOUNTER — Encounter (INDEPENDENT_AMBULATORY_CARE_PROVIDER_SITE_OTHER): Payer: Self-pay | Admitting: Ophthalmology

## 2020-01-26 DIAGNOSIS — H01116 Allergic dermatitis of left eye, unspecified eyelid: Secondary | ICD-10-CM | POA: Diagnosis not present

## 2020-01-26 DIAGNOSIS — H10829 Rosacea conjunctivitis, unspecified eye: Secondary | ICD-10-CM | POA: Diagnosis not present

## 2020-01-26 MED ORDER — DOXYCYCLINE HYCLATE 50 MG PO CAPS: 50.0000 mg | ORAL_CAPSULE | Freq: Every day | ORAL | 2 refills | Status: DC

## 2020-01-26 NOTE — Progress Notes (Signed)
01/26/2020     CHIEF COMPLAINT Patient presents for No chief complaint on file.   HISTORY OF PRESENT ILLNESS: Claudia Rivera is a 64 y.o. female who presents to the clinic today for:   HPI    Pt has been seeing Dr. Delaney Meigs for chronic dry eye. Pt c/o migraines behind OS only. Pt states OU are constantly burning and dry. Pt has tried multiple gtts with no relief.   Last edited by Edmon Crape, MD on 01/26/2020 11:24 AM. (History)      Referring physician: [name], MD [address] [place],  [place] [address]  HISTORICAL INFORMATION:   Selected notes from the MEDICAL RECORD NUMBER       CURRENT MEDICATIONS: No current outpatient medications on file. (Ophthalmic Drugs)   No current facility-administered medications for this visit. (Ophthalmic Drugs)   Current Outpatient Medications (Other)  Medication Sig  . Lactobacillus (PROBIOTIC ACIDOPHILUS PO) Take 1 capsule by mouth daily.  Marland Kitchen LEXAPRO 10 MG tablet TAKE 1 TABLET (10 MG TOTAL) BY MOUTH DAILY.  Marland Kitchen losartan (COZAAR) 25 MG tablet Take 1 tablet (25 mg total) by mouth daily.  . rizatriptan (MAXALT) 10 MG tablet TAKE 1 TABLET BY MOUTH AS NEEDED FOR MIGRAINE  . VYVANSE 40 MG capsule Take 40 mg by mouth every morning.   No current facility-administered medications for this visit. (Other)      REVIEW OF SYSTEMS:    ALLERGIES Allergies  Allergen Reactions  . Codeine Hives and Itching    PAST MEDICAL HISTORY Past Medical History:  Diagnosis Date  . Menopause   . Migraine    Dr. Sandria Manly   Past Surgical History:  Procedure Laterality Date  . BREAST ENHANCEMENT SURGERY  2012  . CATARACT EXTRACTION Bilateral   . DERMOID CYST  EXCISION  1980  . EYE SURGERY Left   . EYE SURGERY Bilateral    Lid sx  . NECK SURGERY  2007   fusion C6/7    FAMILY HISTORY Family History  Problem Relation Age of Onset  . COPD Mother   . Cancer Father        prostate    SOCIAL HISTORY Social History   Tobacco Use   . Smoking status: Never Smoker  . Smokeless tobacco: Never Used  Substance Use Topics  . Alcohol use: Yes    Alcohol/week: 3.0 standard drinks    Types: 3 Standard drinks or equivalent per week  . Drug use: No         OPHTHALMIC EXAM: Base Eye Exam    Visual Acuity (Snellen - Linear)      Right Left   Dist [place] 20/30 20/50   Dist ph [place] 20/20 -1 NI       Pupils      Pupils Dark Light Shape React APD   Right PERRL 4 3 Round Brisk None   Left PERRL 4 3 Round Brisk None       Visual Fields (Counting fingers)      Left Right    Full Full       Neuro/Psych    Oriented x3: Yes   Mood/Affect: Normal          IMAGING AND PROCEDURES  Imaging and Procedures for 01/26/20           ASSESSMENT/PLAN:  No problem-specific Assessment & Plan notes found for this encounter.    No diagnosis found.  1. Stop every drop that has a preservative.  2.  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  Ophthalmic Meds Ordered this visit:  No orders of the defined types were placed in this encounter.      No follow-ups on file.  There are no Patient Instructions on file for this visit.   Explained the diagnoses, plan, and follow up with the patient and they expressed understanding.  Patient expressed understanding of the importance of proper follow up care.   [name] Locust M.D. Diseases & Surgery of the Retina and Vitreous [facility] 01/26/20     Abbreviations: M myopia (nearsighted); A astigmatism; H hyperopia (farsighted); P presbyopia; Mrx spectacle prescription;  CTL contact lenses; OD right eye; OS left eye; OU both eyes  XT exotropia; ET esotropia; PEK punctate epithelial keratitis; PEE punctate epithelial erosions; DES dry eye syndrome; MGD meibomian gland dysfunction; ATs artificial tears; PFAT's preservative free artificial tears; NSC nuclear sclerotic  cataract; PSC posterior subcapsular cataract; ERM epi-retinal membrane; PVD posterior vitreous detachment; RD retinal detachment; DM diabetes mellitus; DR diabetic retinopathy; NPDR non-proliferative diabetic retinopathy; PDR proliferative diabetic retinopathy; CSME clinically significant macular edema; DME diabetic macular edema; dbh dot blot hemorrhages; CWS cotton wool spot; POAG primary open angle glaucoma; C/D cup-to-disc ratio; HVF humphrey visual field; GVF goldmann visual field; OCT optical coherence tomography; IOP intraocular pressure; BRVO Branch retinal vein occlusion; CRVO central retinal vein occlusion; CRAO central retinal artery occlusion; BRAO branch retinal artery occlusion; RT retinal tear; SB scleral buckle; PPV pars plana vitrectomy; VH Vitreous hemorrhage; PRP panretinal laser photocoagulation; IVK intravitreal kenalog; VMT vitreomacular traction; MH Macular hole;  NVD neovascularization of the disc; NVE neovascularization elsewhere; AREDS age related eye disease study; ARMD age related macular degeneration; POAG primary open angle glaucoma; EBMD epithelial/anterior basement membrane dystrophy; ACIOL anterior chamber intraocular lens; IOL intraocular lens; PCIOL posterior chamber intraocular lens; Phaco/IOL phacoemulsification with intraocular lens placement; PRK photorefractive keratectomy; LASIK laser assisted in situ keratomileusis; HTN hypertension; DM diabetes mellitus; COPD chronic obstructive pulmonary disease

## 2020-01-26 NOTE — Patient Instructions (Signed)
If symptoms continues patient is to call for routine follow-up

## 2020-02-14 ENCOUNTER — Other Ambulatory Visit: Payer: Self-pay | Admitting: Orthopedic Surgery

## 2020-02-15 LAB — SARS CORONAVIRUS 2 (TAT 6-24 HRS): SARS Coronavirus 2: NEGATIVE

## 2020-02-29 ENCOUNTER — Other Ambulatory Visit: Payer: Self-pay

## 2020-03-01 ENCOUNTER — Encounter: Payer: Self-pay | Admitting: Infectious Disease

## 2020-03-01 ENCOUNTER — Other Ambulatory Visit: Payer: Self-pay

## 2020-03-01 ENCOUNTER — Telehealth: Payer: Self-pay | Admitting: Infectious Disease

## 2020-03-01 ENCOUNTER — Ambulatory Visit (INDEPENDENT_AMBULATORY_CARE_PROVIDER_SITE_OTHER): Payer: No Typology Code available for payment source | Admitting: Infectious Disease

## 2020-03-01 DIAGNOSIS — T8579XA Infection and inflammatory reaction due to other internal prosthetic devices, implants and grafts, initial encounter: Secondary | ICD-10-CM | POA: Diagnosis not present

## 2020-03-01 DIAGNOSIS — J019 Acute sinusitis, unspecified: Secondary | ICD-10-CM

## 2020-03-01 DIAGNOSIS — R509 Fever, unspecified: Secondary | ICD-10-CM

## 2020-03-01 DIAGNOSIS — J0100 Acute maxillary sinusitis, unspecified: Secondary | ICD-10-CM

## 2020-03-01 HISTORY — DX: Acute sinusitis, unspecified: J01.90

## 2020-03-01 HISTORY — DX: Infection and inflammatory reaction due to other internal prosthetic devices, implants and grafts, initial encounter: T85.79XA

## 2020-03-01 LAB — SARS CORONAVIRUS 2 (TAT 6-24 HRS): SARS Coronavirus 2: NEGATIVE

## 2020-03-01 MED ORDER — AMOXICILLIN-POT CLAVULANATE 875-125 MG PO TABS: 1.0000 | ORAL_TABLET | Freq: Two times a day (BID) | ORAL | 1 refills | Status: DC

## 2020-03-01 NOTE — Telephone Encounter (Signed)
Claudia Rivera here is pt for 1:30

## 2020-03-01 NOTE — Progress Notes (Signed)
Subjective:  Reason for Consult: concern for infected breast implant  Requesting Physician: Dr. Leta Baptist   Patient ID: Claudia Rivera, female    DOB: 30-Nov-1955, 64 y.o.   MRN: [id number]  HPI  Claudia Rivera is a 64 year old Caucasian female with a past medical history significant for breast implantation surgery, hypertension Raynaud's, hypoglycemia of unknown cause who recently underwent removal of prior breast implants and implantation of new implants on February 17, 2020.  Drain was left in place and then removed this past Saturday August the 14th.   Sunday she received a booster shot (3rd shot) of COVID 19 Pfizer vaccine.  In the room she experienced high fevers which yesterday went up to 102 even in the context of Tylenol.  She has felt chills and had worsening blurry vision and headaches.  Left breast is tender prickly on the underside of the breast but throughout there is also some increasing erythema present.  She was seen by Dr. Leta Baptist who is in ultrasound guidance aspirated some serosanguineous material that was sent for Gram stain which did not show any organisms.  Cultures incubating she received a 1 dose of 2 g of IM ceftriaxone yesterday and was prescribed Augmentin that she is going to begin today.  Is been Dr. Thurston Hole arrange for labs to be done yesterday and her white count had risen from a normal value preoperatively to 19,700 with a predominance of neutrophils.  Globin and platelets were normal CMP was normal  In the interim she is also begun to develop pain over her right maxillary sinus that is tender to palpation.  [name] is a pale intensely cold and to feel poorly with malaise.  Past Medical History:  Diagnosis Date  . Acute sinusitis 03/01/2020  . Infection of breast implant ([facility]) 03/01/2020  . Menopause   . Migraine    Dr. Sandria Manly    Past Surgical History:  Procedure Laterality Date  . BREAST ENHANCEMENT SURGERY  2012  . CATARACT EXTRACTION Bilateral   . DERMOID CYST   EXCISION  1980  . EYE SURGERY Left   . EYE SURGERY Bilateral    Lid sx  . NECK SURGERY  2007   fusion C6/7    Family History  Problem Relation Age of Onset  . COPD Mother   . Cancer Father        prostate      Social History   Socioeconomic History  . Marital status: Married    Spouse name: Not on file  . Number of children: Not on file  . Years of education: Not on file  . Highest education level: Not on file  Occupational History  . Not on file  Tobacco Use  . Smoking status: Never Smoker  . Smokeless tobacco: Never Used  Substance and Sexual Activity  . Alcohol use: Yes    Alcohol/week: 3.0 standard drinks    Types: 3 Standard drinks or equivalent per week  . Drug use: No  . Sexual activity: Yes    Birth control/protection: Post-menopausal  Other Topics Concern  . Not on file  Social History Narrative  . Not on file   Social Determinants of Health   Financial Resource Strain:   . Difficulty of Paying Living Expenses:   Food Insecurity:   . Worried About Programme researcher, broadcasting/film/video in the Last Year:   . Barista in the Last Year:   Transportation Needs:   . Freight forwarder (Medical):   Marland Kitchen Lack  of Transportation (Non-Medical):   Physical Activity:   . Days of Exercise per Week:   . Minutes of Exercise per Session:   Stress:   . Feeling of Stress :   Social Connections:   . Frequency of Communication with Friends and Family:   . Frequency of Social Gatherings with Friends and Family:   . Attends Religious Services:   . Active Member of Clubs or Organizations:   . Attends Banker Meetings:   Marland Kitchen Marital Status:     Allergies  Allergen Reactions  . Codeine Hives and Itching     Current Outpatient Medications:  .  amoxicillin-clavulanate (AUGMENTIN) 875-125 MG tablet, Take 1 tablet by mouth 2 (two) times daily., Disp: 7 tablet, Rfl: 1 .  doxycycline (VIBRAMYCIN) 50 MG capsule, Take 1 capsule (50 mg total) by mouth daily., Disp: 90  capsule, Rfl: 2 .  Lactobacillus (PROBIOTIC ACIDOPHILUS PO), Take 1 capsule by mouth daily., Disp: , Rfl:  .  LEXAPRO 10 MG tablet, TAKE 1 TABLET (10 MG TOTAL) BY MOUTH DAILY., Disp: 90 tablet, Rfl: 3 .  losartan (COZAAR) 25 MG tablet, Take 1 tablet (25 mg total) by mouth daily., Disp: 90 tablet, Rfl: 3 .  rizatriptan (MAXALT) 10 MG tablet, TAKE 1 TABLET BY MOUTH AS NEEDED FOR MIGRAINE, Disp: 30 tablet, Rfl: 5 .  VYVANSE 40 MG capsule, Take 40 mg by mouth every morning., Disp: , Rfl: \   Review of Systems  Constitutional: Negative for activity change, appetite change, chills, diaphoresis, fatigue, fever and unexpected weight change.  HENT: Positive for facial swelling and sinus pressure. Negative for congestion, rhinorrhea, sneezing, sore throat and trouble swallowing.   Eyes: Positive for visual disturbance. Negative for photophobia.  Respiratory: Negative for cough, chest tightness, shortness of breath, wheezing and stridor.   Cardiovascular: Negative for chest pain, palpitations and leg swelling.  Gastrointestinal: Negative for abdominal distention, abdominal pain, anal bleeding, blood in stool, constipation, diarrhea, nausea and vomiting.  Genitourinary: Negative for difficulty urinating, dysuria, flank pain and hematuria.  Musculoskeletal: Negative for arthralgias, back pain, gait problem, joint swelling and myalgias.  Skin: Positive for color change and wound. Negative for pallor and rash.  Neurological: Negative for dizziness, tremors, weakness and light-headedness.  Hematological: Negative for adenopathy. Does not bruise/bleed easily.  Psychiatric/Behavioral: Negative for agitation, behavioral problems, confusion, decreased concentration, dysphoric mood and sleep disturbance.       Objective:   Physical Exam Constitutional:      General: She is not in acute distress.    Appearance: She is not diaphoretic.  HENT:     Head: Normocephalic and atraumatic.      Right Ear: External  ear normal.     Left Ear: External ear normal.     Nose: Nose normal. No rhinorrhea.     Mouth/Throat:     Mouth: Mucous membranes are moist.     Pharynx: No oropharyngeal exudate.  Eyes:     General: No scleral icterus.    Extraocular Movements: Extraocular movements intact.     Conjunctiva/sclera: Conjunctivae normal.  Cardiovascular:     Rate and Rhythm: Normal rate and regular rhythm.  Pulmonary:     Effort: Pulmonary effort is normal. No respiratory distress.     Breath sounds: No wheezing.  Abdominal:     General: Bowel sounds are normal. There is no distension.     Palpations: Abdomen is soft.  Musculoskeletal:        General: No tenderness. Normal range of  motion.     Cervical back: Normal range of motion and neck supple.  Lymphadenopathy:     Cervical: No cervical adenopathy.  Skin:    General: Skin is warm and dry.     Coloration: Skin is not pale.     Findings: No erythema or rash.  Neurological:     General: No focal deficit present.     Mental Status: She is alert and oriented to person, place, and time. Mental status is at baseline.     Coordination: Coordination normal.  Psychiatric:        Mood and Affect: Mood normal.        Behavior: Behavior normal.        Thought Content: Thought content normal.        Judgment: Judgment normal.    Left breast 03/01/2020:           Assessment & Plan:   Breast implant related infection:  I agree with Augmentin for now.  I also will get some blood cultures given how systemically ill she has been recently.  She is going to see Dr.Thimmappa tomorrow and I am scheduling her to see me last week of August  I fear she will need removal of breast implant  Cultures were done would send for aerobic anaerobic as well as AFB cultures  Right-sided sinusitis: augmentin will cover this well and would give for 10 days at least though I expect her breast implant is the bigger problem  High fevers: likely due to above but  confounder that she also received Pfizer vaccine number 3

## 2020-03-02 LAB — CULTURE, BLOOD (SINGLE)

## 2020-03-07 LAB — CULTURE, BLOOD (SINGLE)
MICRO NUMBER:: 10843252
MICRO NUMBER:: 10843253

## 2020-03-14 ENCOUNTER — Ambulatory Visit: Payer: No Typology Code available for payment source | Admitting: Infectious Disease

## 2020-11-02 ENCOUNTER — Other Ambulatory Visit: Payer: Self-pay | Admitting: Obstetrics and Gynecology

## 2020-11-02 DIAGNOSIS — T8543XS Leakage of breast prosthesis and implant, sequela: Secondary | ICD-10-CM

## 2020-11-07 ENCOUNTER — Other Ambulatory Visit: Payer: Self-pay

## 2020-11-07 ENCOUNTER — Ambulatory Visit
Admission: RE | Admit: 2020-11-07 | Discharge: 2020-11-07 | Disposition: A | Discharge status: Home / Self Care | Payer: No Typology Code available for payment source | Source: Ambulatory Visit | Attending: Obstetrics and Gynecology | Admitting: Obstetrics and Gynecology

## 2020-11-07 DIAGNOSIS — T8543XS Leakage of breast prosthesis and implant, sequela: Secondary | ICD-10-CM

## 2022-01-16 ENCOUNTER — Other Ambulatory Visit: Payer: Self-pay | Admitting: Obstetrics and Gynecology

## 2022-01-16 DIAGNOSIS — T8543XA Leakage of breast prosthesis and implant, initial encounter: Secondary | ICD-10-CM

## 2022-01-30 ENCOUNTER — Ambulatory Visit
Admission: RE | Admit: 2022-01-30 | Discharge: 2022-01-30 | Disposition: A | Discharge status: Home / Self Care | Payer: No Typology Code available for payment source | Source: Ambulatory Visit | Attending: Obstetrics and Gynecology | Admitting: Obstetrics and Gynecology

## 2022-01-30 DIAGNOSIS — T8543XA Leakage of breast prosthesis and implant, initial encounter: Secondary | ICD-10-CM

## 2022-02-10 ENCOUNTER — Encounter (INDEPENDENT_AMBULATORY_CARE_PROVIDER_SITE_OTHER): Payer: Self-pay | Admitting: Ophthalmology

## 2022-02-10 ENCOUNTER — Ambulatory Visit (INDEPENDENT_AMBULATORY_CARE_PROVIDER_SITE_OTHER): Payer: No Typology Code available for payment source | Admitting: Ophthalmology

## 2022-02-10 DIAGNOSIS — H00022 Hordeolum internum right lower eyelid: Secondary | ICD-10-CM | POA: Insufficient documentation

## 2022-02-10 MED ORDER — BACITRACIN-POLYMYXIN B 500-10000 UNIT/GM OP OINT: TOPICAL_OINTMENT | Freq: Two times a day (BID) | OPHTHALMIC | 1 refills | Status: AC

## 2022-02-10 NOTE — Progress Notes (Signed)
Claudia Rivera at phone call today 66 year old woman who has symptoms of stye finding of stye by photo sent to me via text messaging showing stye on the right eye.  Not improving with warm compresses.  Photo consultation with phone consultation.  Patient be started on topical ophthalmic antibiotic ointment  Prescription sent electronically to [facility] [address]

## 2022-02-10 NOTE — Assessment & Plan Note (Signed)
Commence topical therapy Polysporin ophthalmic ointment twice daily for 3 to 5 days followed thereafter by nightly

## 2022-09-17 ENCOUNTER — Ambulatory Visit: Payer: No Typology Code available for payment source | Admitting: Orthopedic Surgery

## 2022-09-17 ENCOUNTER — Encounter: Payer: Self-pay | Admitting: Orthopedic Surgery

## 2022-09-17 DIAGNOSIS — L03031 Cellulitis of right toe: Secondary | ICD-10-CM | POA: Diagnosis not present

## 2022-09-17 MED ORDER — NITROGLYCERIN 0.2 MG/HR TD PT24: 0.2000 mg | MEDICATED_PATCH | Freq: Every day | TRANSDERMAL | 12 refills | Status: AC

## 2022-09-17 MED ORDER — PENTOXIFYLLINE ER 400 MG PO TBCR: 400.0000 mg | EXTENDED_RELEASE_TABLET | Freq: Three times a day (TID) | ORAL | 3 refills | Status: AC

## 2022-09-17 NOTE — Progress Notes (Signed)
Office Visit Note   Patient: Claudia Rivera           Date of Birth: [birth year]           MRN: [id number] Visit Date: 09/17/2022              Requested by: Chesley Noon, MD [address] [place],  [place] [address] PCP: Chesley Noon, MD  Chief Complaint  Patient presents with   Right Foot - Pain      HPI: Patient is a 67 year old woman who presents for cellulitis fever and chills right foot.  Patient is status post forefoot reconstruction in [place].  Patient had wound dehiscence after the initial surgery.  This underwent repeat debridement and wound closure patient has been having swelling of the fourth toe cellulitis drainage and fevers in the afternoon low-grade around 99.  Patient does have a history of Raynaud's but no history of rheumatologic underlying disorder.  Patient did lacerate her dorsalis pedis artery years ago.  Assessment & Plan: Visit Diagnoses:  1. Cellulitis of fourth toe of right foot     Plan: Will plan for repeat debridement of the fourth ray.  Plan to extend the previous surgical incision obtained tissue graft from the residual metatarsal as well as soft tissue.  Will place vancomycin powder and Kerecis tissue graft and a wound VAC.  Will start Krentel and a nitroglycerin patch at this time.  She will continue her doxycycline.  Follow-Up Instructions: Return in about 1 week (around 09/24/2022).   Ortho Exam  Patient is alert, oriented, no adenopathy, well-dressed, normal affect, normal respiratory effort. Examination patient has a palpable anterior tibial pulse and a palpable dorsalis pedis pulse.  She does not have a palpable pulse distal to the previous laceration.  The Doppler was used and patient has a multiphasic anterior tibial and posterior tibial pulse.  Patient has sausage digit swelling of the fourth toe there is some clear drainage from a few of the percutaneous wounds.  With elevation and massage the cellulitis does  not resolve.  Review of her MRI scan did show intramedullary edema of the entire fourth metatarsal from a stress fracture.  Patient is status post fourth metatarsal head resection as well as soft tissue reconstruction for the claw toes.  Patient did have a pin that extended down the shaft of the fourth metatarsal and this is concerning for possible deep bone infection to the fourth metatarsal.  Imaging: No results found. No images are attached to the encounter.  Labs: Lab Results  Component Value Date   CRP <0.02 06/26/2011   REPTSTATUS 08/04/2010 FINAL 07/30/2010   REPTSTATUS 08/05/2010 FINAL 07/30/2010   GRAMSTAIN  07/30/2010    RARE WBC PRESENT, PREDOMINANTLY PMN FEW SQUAMOUS EPITHELIAL CELLS PRESENT RARE GRAM POSITIVE COCCI IN PAIRS   GRAMSTAIN  07/30/2010    RARE WBC PRESENT, PREDOMINANTLY PMN FEW SQUAMOUS EPITHELIAL CELLS PRESENT RARE GRAM POSITIVE COCCI IN PAIRS   CULT NO ANAEROBES ISOLATED 07/30/2010   CULT  07/30/2010    ABUNDANT STAPHYLOCOCCUS AUREUS Note: RIFAMPIN AND GENTAMICIN SHOULD NOT BE USED AS SINGLE DRUGS FOR TREATMENT OF STAPH INFECTIONS.   LABORGA STAPHYLOCOCCUS AUREUS 07/30/2010     Lab Results  Component Value Date   ALBUMIN 4.1 04/11/2014   ALBUMIN 4.3 01/21/2013   ALBUMIN 4.3 07/11/2011    No results found for: "MG" Lab Results  Component Value Date   VD25OH 47 04/11/2014   VD25OH 45 01/21/2013  No results found for: "PREALBUMIN"    Latest Ref Rng & Units 04/11/2014    9:40 AM 01/21/2013    2:14 PM  CBC EXTENDED  WBC 4.0 - 10.5 K/uL 5.8  6.9   RBC 3.87 - 5.11 MIL/uL 4.66  4.87   Hemoglobin 12.0 - 15.0 g/dL 14.4  14.5   HCT 36.0 - 46.0 % 41.8  42.7   Platelets 150 - 400 K/uL 190  192   NEUT# 1.7 - 7.7 K/uL 3.2  4.3   Lymph# 0.7 - 4.0 K/uL 1.9  1.9      There is no height or weight on file to calculate BMI.  Orders:  No orders of the defined types were placed in this encounter.  No orders of the defined types were placed in  this encounter.    Procedures: No procedures performed  Clinical Data: No additional findings.  ROS:  All other systems negative, except as noted in the HPI. Review of Systems  Objective: Vital Signs: There were no vitals taken for this visit.  Specialty Comments:  No specialty comments available.  PMFS History: Patient Active Problem List   Diagnosis Date Noted   Hordeolum internum of right lower eyelid 02/10/2022   Acute sinusitis 03/01/2020   Infection of breast implant ([place]) 03/01/2020   Rosacea blepharoconjunctivitis 01/26/2020   Contact dermatitis of left eyelid 01/26/2020   Encounter for counseling 11/09/2019   Right flank pain 08/15/2013   OAB (overactive bladder) 08/15/2013   Osteopenia 09/12/2011   Underweight 03/19/2011   Migraine    Menopause    Past Medical History:  Diagnosis Date   Acute sinusitis 03/01/2020   Infection of breast implant ([facility]) 03/01/2020   Menopause    Migraine    Dr. Erling Cruz    Family History  Problem Relation Age of Onset   COPD Mother    Cancer Father        prostate    Past Surgical History:  Procedure Laterality Date   BREAST ENHANCEMENT SURGERY  2012   CATARACT EXTRACTION Bilateral    DERMOID CYST  EXCISION  1980   EYE SURGERY Left    EYE SURGERY Bilateral    Lid sx   NECK SURGERY  2007   fusion C6/7   Social History   Occupational History   Not on file  Tobacco Use   Smoking status: Never   Smokeless tobacco: Never  Substance and Sexual Activity   Alcohol use: Yes    Alcohol/week: 3.0 standard drinks of alcohol    Types: 3 Standard drinks or equivalent per week   Drug use: No   Sexual activity: Yes    Birth control/protection: Post-menopausal

## 2022-09-18 ENCOUNTER — Other Ambulatory Visit: Payer: Self-pay

## 2022-09-18 ENCOUNTER — Encounter (HOSPITAL_COMMUNITY): Payer: Self-pay | Admitting: Orthopedic Surgery

## 2022-09-18 NOTE — Progress Notes (Addendum)
PCP - Dr. Anastasia Pall Cardiologist - Denies  PPM/ICD - Denies  Chest x-ray - n/a EKG - 03/03/2019 Stress Test - n/a ECHO - 03/08/2019 Cardiac Cath -n/a  Sleep study/sleep apnea/CPAP - Denies  Non-Diabetic  Blood Thinner Instructions: Denies Aspirin Instructions: Denies  ERAS Protcol - Yes, clear liquids 3 hours prior to surgery  COVID TEST- Denies  Anesthesia review: No  Patient verbally denies any shortness of breath, fever, cough and chest pain during phone call   -------------  SDW INSTRUCTIONS given:  Your procedure is scheduled on Friday September 20, 2022  Report to [facility] Main Entrance "A" at 0830 A.M., and check in at the Admitting office.  Call this number if you have problems the morning of surgery:  [phone]   Remember:  Do not eat after midnight the night before your surgery  You may drink clear liquids until 0800 the morning of your surgery.   Clear liquids allowed are: Water, Non-Citrus Juices (without pulp), Carbonated Beverages, Clear Tea, Black Coffee Only, and Gatorade   Take these medicines the morning of surgery with A SIP OF WATER  Tylenol, Doxycycline, Lexapro, Trental, Rosuvastatin, Vyvanse  As needed: Maxalt  As of today, STOP taking any Aspirin (unless otherwise instructed by your surgeon) Aleve, Naproxen, Ibuprofen, Motrin, Advil, Goody's, BC's, all herbal medications, fish oil, and all vitamins.                      Do not wear jewelry, make up, or nail polish            Do not wear lotions, powders, perfumes or deodorant.            Do not shave 48 hours prior to surgery.              Do not bring valuables to the hospital.             [facility] is not responsible for any belongings or valuables.  Do NOT Smoke (Tobacco/Vaping) 24 hours prior to your procedure  If you use a CPAP at night, you may bring all equipment for your overnight stay.   Contacts, glasses, dentures or bridgework may not be worn into surgery.      For  patients admitted to the hospital, discharge time will be determined by your treatment team.   Patients discharged the day of surgery will not be allowed to drive home, and someone needs to stay with them for 24 hours.    Special instructions:   [facility]- Preparing For Surgery  Before surgery, you can play an important role. Because skin is not sterile, your skin needs to be as free of germs as possible. You can reduce the number of germs on your skin by washing with Dial Soap before surgery.   Oral Hygiene is also important to reduce your risk of infection.  Remember - BRUSH YOUR TEETH THE MORNING OF SURGERY WITH YOUR REGULAR TOOTHPASTE  Please follow these instructions carefully.   Shower the NIGHT BEFORE SURGERY and the MORNING OF SURGERY with DIAL Soap.   Pat yourself dry with a CLEAN TOWEL.  Wear CLEAN PAJAMAS to bed the night before surgery  Place CLEAN SHEETS on your bed the night of your first shower and DO NOT SLEEP WITH PETS.   Day of Surgery: Please shower morning of surgery  Wear Clean/Comfortable clothing the morning of surgery Do not apply any deodorants/lotions.   Remember to brush your teeth WITH  YOUR REGULAR TOOTHPASTE.   Questions were answered. Patient verbalized understanding of instructions.

## 2022-09-19 NOTE — Anesthesia Preprocedure Evaluation (Addendum)
Anesthesia Evaluation  Patient identified by MRN, date of birth, ID band Patient awake    Reviewed: Allergy & Precautions, NPO status , Patient's Chart, lab work & pertinent test results  History of Anesthesia Complications (+) PONV and history of anesthetic complications (h/o corneal abraision with MAC)  Airway Mallampati: I  TM Distance: >3 FB Neck ROM: Full    Dental  (+) Caps, Dental Advisory Given   Pulmonary    breath sounds clear to auscultation       Cardiovascular hypertension, Pt. on medications (-) angina  Rhythm:Regular Rate:Normal  '20 ECHO: EF 55-60%, mild increased LVH, no significant valvular abnormalities   Neuro/Psych  Headaches    GI/Hepatic negative GI ROS, Neg liver ROS,,,  Endo/Other  negative endocrine ROS    Renal/GU negative Renal ROS     Musculoskeletal   Abdominal   Peds  Hematology negative hematology ROS (+)   Anesthesia Other Findings   Reproductive/Obstetrics                              Anesthesia Physical Anesthesia Plan  ASA: 2  Anesthesia Plan: General   Post-op Pain Management: Tylenol PO (pre-op)*   Induction: Intravenous  PONV Risk Score and Plan: 4 or greater and Ondansetron, Dexamethasone and Scopolamine patch - Pre-op  Airway Management Planned: LMA  Additional Equipment: None  Intra-op Plan:   Post-operative Plan:   Informed Consent: I have reviewed the patients History and Physical, chart, labs and discussed the procedure including the risks, benefits and alternatives for the proposed anesthesia with the patient or authorized representative who has indicated his/her understanding and acceptance.     Dental advisory given  Plan Discussed with: CRNA and Surgeon  Anesthesia Plan Comments:          Anesthesia Quick Evaluation

## 2022-09-20 ENCOUNTER — Encounter (HOSPITAL_COMMUNITY)
Admission: RE | Disposition: A | Discharge status: Home / Self Care | Payer: Self-pay | Source: Home / Self Care | Attending: Orthopedic Surgery

## 2022-09-20 ENCOUNTER — Ambulatory Visit (HOSPITAL_COMMUNITY): Payer: No Typology Code available for payment source | Admitting: Certified Registered"

## 2022-09-20 ENCOUNTER — Other Ambulatory Visit: Payer: Self-pay

## 2022-09-20 ENCOUNTER — Ambulatory Visit (HOSPITAL_COMMUNITY)
Admission: RE | Admit: 2022-09-20 | Discharge: 2022-09-20 | Disposition: A | Discharge status: Home / Self Care | Payer: No Typology Code available for payment source | Attending: Orthopedic Surgery | Admitting: Orthopedic Surgery

## 2022-09-20 ENCOUNTER — Ambulatory Visit (HOSPITAL_BASED_OUTPATIENT_CLINIC_OR_DEPARTMENT_OTHER): Payer: No Typology Code available for payment source | Admitting: Certified Registered"

## 2022-09-20 ENCOUNTER — Encounter (HOSPITAL_COMMUNITY): Payer: Self-pay | Admitting: Orthopedic Surgery

## 2022-09-20 DIAGNOSIS — I10 Essential (primary) hypertension: Secondary | ICD-10-CM | POA: Insufficient documentation

## 2022-09-20 DIAGNOSIS — T8131XA Disruption of external operation (surgical) wound, not elsewhere classified, initial encounter: Secondary | ICD-10-CM

## 2022-09-20 DIAGNOSIS — Z79899 Other long term (current) drug therapy: Secondary | ICD-10-CM | POA: Diagnosis not present

## 2022-09-20 DIAGNOSIS — L03031 Cellulitis of right toe: Secondary | ICD-10-CM

## 2022-09-20 HISTORY — DX: Essential (primary) hypertension: I10

## 2022-09-20 HISTORY — PX: I & D EXTREMITY: SHX5045

## 2022-09-20 HISTORY — DX: Raynaud's syndrome without gangrene: I73.00

## 2022-09-20 HISTORY — DX: Other complications of anesthesia, initial encounter: T88.59XA

## 2022-09-20 HISTORY — DX: Other specified postprocedural states: Z98.890

## 2022-09-20 LAB — CBC WITH DIFFERENTIAL/PLATELET
Abs Immature Granulocytes: 0.02 10*3/uL (ref 0.00–0.07)
Basophils Absolute: 0.1 10*3/uL (ref 0.0–0.1)
Basophils Relative: 1 %
Eosinophils Absolute: 0.2 10*3/uL (ref 0.0–0.5)
Eosinophils Relative: 3 %
HCT: 42.4 % (ref 36.0–46.0)
Hemoglobin: 14.3 g/dL (ref 12.0–15.0)
Immature Granulocytes: 0 %
Lymphocytes Relative: 27 %
Lymphs Abs: 1.6 10*3/uL (ref 0.7–4.0)
MCH: 32.4 pg (ref 26.0–34.0)
MCHC: 33.7 g/dL (ref 30.0–36.0)
MCV: 95.9 fL (ref 80.0–100.0)
Monocytes Absolute: 0.7 10*3/uL (ref 0.1–1.0)
Monocytes Relative: 11 %
Neutro Abs: 3.6 10*3/uL (ref 1.7–7.7)
Neutrophils Relative %: 58 %
Platelets: 167 10*3/uL (ref 150–400)
RBC: 4.42 MIL/uL (ref 3.87–5.11)
RDW: 12.5 % (ref 11.5–15.5)
WBC: 6.2 10*3/uL (ref 4.0–10.5)
nRBC: 0 % (ref 0.0–0.2)

## 2022-09-20 LAB — COMPREHENSIVE METABOLIC PANEL
ALT: 21 U/L (ref 0–44)
AST: 27 U/L (ref 15–41)
Albumin: 3.6 g/dL (ref 3.5–5.0)
Alkaline Phosphatase: 36 U/L — ABNORMAL LOW (ref 38–126)
Anion gap: 9 (ref 5–15)
BUN: 21 mg/dL (ref 8–23)
CO2: 24 mmol/L (ref 22–32)
Calcium: 8.6 mg/dL — ABNORMAL LOW (ref 8.9–10.3)
Chloride: 107 mmol/L (ref 98–111)
Creatinine, Ser: 0.79 mg/dL (ref 0.44–1.00)
GFR, Estimated: >60 mL/min (ref >60)
Glucose, Bld: 95 mg/dL (ref 70–99)
Potassium: 4.1 mmol/L (ref 3.5–5.1)
Sodium: 140 mmol/L (ref 135–145)
Total Bilirubin: 0.7 mg/dL (ref 0.3–1.2)
Total Protein: 5.9 g/dL — ABNORMAL LOW (ref 6.5–8.1)

## 2022-09-20 LAB — SEDIMENTATION RATE: Sed Rate: 2 mm/hr (ref 0–22)

## 2022-09-20 LAB — C-REACTIVE PROTEIN: CRP: <0.5 mg/dL (ref <1.0)

## 2022-09-20 SURGERY — IRRIGATION AND DEBRIDEMENT EXTREMITY
Anesthesia: General | Site: Foot | Laterality: Right

## 2022-09-20 MED ORDER — PROMETHAZINE HCL 25 MG/ML IJ SOLN: 6.2500 mg | INTRAMUSCULAR | Status: DC | PRN

## 2022-09-20 MED ORDER — LACTATED RINGERS IV SOLN: INTRAVENOUS | Status: DC

## 2022-09-20 MED ORDER — OXYCODONE HCL 5 MG PO TABS
5.0000 mg | ORAL_TABLET | Freq: Once | ORAL | Status: AC | PRN
  Administered 2022-09-20: 5 mg via ORAL

## 2022-09-20 MED ORDER — VANCOMYCIN HCL 500 MG IV SOLR
INTRAVENOUS | Status: DC | PRN
  Administered 2022-09-20: 500 mg via TOPICAL

## 2022-09-20 MED ORDER — FENTANYL CITRATE (PF) 100 MCG/2ML IJ SOLN
25.0000 ug | INTRAMUSCULAR | Status: DC | PRN
  Administered 2022-09-20: 50 ug via INTRAVENOUS

## 2022-09-20 MED ORDER — FENTANYL CITRATE (PF) 100 MCG/2ML IJ SOLN
INTRAMUSCULAR | Status: AC
  Filled 2022-09-20: qty 2

## 2022-09-20 MED ORDER — FENTANYL CITRATE (PF) 100 MCG/2ML IJ SOLN
INTRAMUSCULAR | Status: DC | PRN
  Administered 2022-09-20 (×2): 25 ug via INTRAVENOUS

## 2022-09-20 MED ORDER — DOXYCYCLINE HYCLATE 100 MG PO TABS: 100.0000 mg | ORAL_TABLET | Freq: Two times a day (BID) | ORAL | 0 refills | Status: DC

## 2022-09-20 MED ORDER — DEXAMETHASONE SODIUM PHOSPHATE 10 MG/ML IJ SOLN
INTRAMUSCULAR | Status: AC
  Filled 2022-09-20: qty 1

## 2022-09-20 MED ORDER — KETOROLAC TROMETHAMINE 30 MG/ML IJ SOLN
INTRAMUSCULAR | Status: DC | PRN
  Administered 2022-09-20: 30 mg via INTRAVENOUS

## 2022-09-20 MED ORDER — SCOPOLAMINE 1 MG/3DAYS TD PT72
1.0000 | MEDICATED_PATCH | TRANSDERMAL | Status: DC
  Filled 2022-09-20: qty 1

## 2022-09-20 MED ORDER — MIDAZOLAM HCL 2 MG/2ML IJ SOLN: 0.5000 mg | Freq: Once | INTRAMUSCULAR | Status: DC | PRN

## 2022-09-20 MED ORDER — ONDANSETRON HCL 4 MG/2ML IJ SOLN
INTRAMUSCULAR | Status: DC | PRN
  Administered 2022-09-20: 4 mg via INTRAVENOUS

## 2022-09-20 MED ORDER — CEFAZOLIN SODIUM-DEXTROSE 2-4 GM/100ML-% IV SOLN
2.0000 g | INTRAVENOUS | Status: AC
  Administered 2022-09-20: 2 g via INTRAVENOUS
  Filled 2022-09-20: qty 100

## 2022-09-20 MED ORDER — VANCOMYCIN HCL IN DEXTROSE 1-5 GM/200ML-% IV SOLN
INTRAVENOUS | Status: AC
  Filled 2022-09-20: qty 200

## 2022-09-20 MED ORDER — LACTATED RINGERS IV SOLN: INTRAVENOUS | Status: DC | PRN

## 2022-09-20 MED ORDER — LIDOCAINE HCL (PF) 1 % IJ SOLN
INTRAMUSCULAR | Status: AC
  Filled 2022-09-20: qty 30

## 2022-09-20 MED ORDER — FENTANYL CITRATE (PF) 250 MCG/5ML IJ SOLN
INTRAMUSCULAR | Status: AC
  Filled 2022-09-20: qty 5

## 2022-09-20 MED ORDER — DEXAMETHASONE SODIUM PHOSPHATE 10 MG/ML IJ SOLN
INTRAMUSCULAR | Status: DC | PRN
  Administered 2022-09-20: 5 mg via INTRAVENOUS

## 2022-09-20 MED ORDER — LIDOCAINE 2% (20 MG/ML) 5 ML SYRINGE
INTRAMUSCULAR | Status: DC | PRN
  Administered 2022-09-20: 20 mg via INTRAVENOUS

## 2022-09-20 MED ORDER — 0.9 % SODIUM CHLORIDE (POUR BTL) OPTIME
TOPICAL | Status: DC | PRN
  Administered 2022-09-20: 1000 mL

## 2022-09-20 MED ORDER — ORAL CARE MOUTH RINSE: 15.0000 mL | Freq: Once | OROMUCOSAL | Status: AC

## 2022-09-20 MED ORDER — PROPOFOL 500 MG/50ML IV EMUL
INTRAVENOUS | Status: DC | PRN
  Administered 2022-09-20: 150 ug/kg/min via INTRAVENOUS

## 2022-09-20 MED ORDER — MIDAZOLAM HCL 2 MG/2ML IJ SOLN
INTRAMUSCULAR | Status: AC
  Filled 2022-09-20: qty 2

## 2022-09-20 MED ORDER — VANCOMYCIN HCL 1000 MG IV SOLR
INTRAVENOUS | Status: DC | PRN
  Administered 2022-09-20: 1000 mg via INTRAVENOUS

## 2022-09-20 MED ORDER — PROPOFOL 10 MG/ML IV BOLUS
INTRAVENOUS | Status: DC | PRN
  Administered 2022-09-20: 150 mg via INTRAVENOUS

## 2022-09-20 MED ORDER — VANCOMYCIN HCL 500 MG IV SOLR
INTRAVENOUS | Status: AC
  Filled 2022-09-20: qty 10

## 2022-09-20 MED ORDER — KETOROLAC TROMETHAMINE 30 MG/ML IJ SOLN
INTRAMUSCULAR | Status: AC
  Filled 2022-09-20: qty 1

## 2022-09-20 MED ORDER — OXYCODONE HCL 5 MG PO TABS
ORAL_TABLET | ORAL | Status: AC
  Filled 2022-09-20: qty 1

## 2022-09-20 MED ORDER — CHLORHEXIDINE GLUCONATE 0.12 % MT SOLN
15.0000 mL | Freq: Once | OROMUCOSAL | Status: AC
  Administered 2022-09-20: 15 mL via OROMUCOSAL
  Filled 2022-09-20: qty 15

## 2022-09-20 MED ORDER — ACETAMINOPHEN 500 MG PO TABS
1000.0000 mg | ORAL_TABLET | Freq: Once | ORAL | Status: AC
Start: 2022-09-20 — End: 2022-09-20
  Administered 2022-09-20: 1000 mg via ORAL
  Filled 2022-09-20: qty 2

## 2022-09-20 MED ORDER — OXYCODONE HCL 5 MG/5ML PO SOLN: 5.0000 mg | Freq: Once | ORAL | Status: AC | PRN

## 2022-09-20 MED ORDER — ONDANSETRON HCL 4 MG/2ML IJ SOLN
INTRAMUSCULAR | Status: AC
  Filled 2022-09-20: qty 2

## 2022-09-20 MED ORDER — PROPOFOL 10 MG/ML IV BOLUS
INTRAVENOUS | Status: AC
  Filled 2022-09-20: qty 20

## 2022-09-20 MED ORDER — PROPOFOL 1000 MG/100ML IV EMUL
INTRAVENOUS | Status: AC
  Filled 2022-09-20: qty 100

## 2022-09-20 MED ORDER — LIDOCAINE HCL 1 % IJ SOLN
INTRAMUSCULAR | Status: DC | PRN
  Administered 2022-09-20: 10 mL

## 2022-09-20 MED ORDER — MIDAZOLAM HCL 5 MG/5ML IJ SOLN
INTRAMUSCULAR | Status: DC | PRN
  Administered 2022-09-20 (×2): 1 mg via INTRAVENOUS

## 2022-09-20 MED ORDER — MEPERIDINE HCL 25 MG/ML IJ SOLN: 6.2500 mg | INTRAMUSCULAR | Status: DC | PRN

## 2022-09-20 SURGICAL SUPPLY — 48 items
BAG COUNTER SPONGE SURGICOUNT (BAG) IMPLANT
BAG SPNG CNTER NS LX DISP (BAG)
BLADE AVERAGE 25X9 (BLADE) IMPLANT
BLADE SURG 21 STRL SS (BLADE) ×2 IMPLANT
BNDG CMPR 5X4 CHSV STRCH STRL (GAUZE/BANDAGES/DRESSINGS) ×1
BNDG CMPR 5X6 CHSV STRCH STRL (GAUZE/BANDAGES/DRESSINGS)
BNDG COHESIVE 4X5 TAN STRL LF (GAUZE/BANDAGES/DRESSINGS) IMPLANT
BNDG COHESIVE 6X5 TAN ST LF (GAUZE/BANDAGES/DRESSINGS) IMPLANT
BNDG GAUZE DERMACEA FLUFF 4 (GAUZE/BANDAGES/DRESSINGS) ×4 IMPLANT
BNDG GZE DERMACEA 4 6PLY (GAUZE/BANDAGES/DRESSINGS)
CNTNR URN SCR LID CUP LEK RST (MISCELLANEOUS) IMPLANT
CONT SPEC 4OZ STRL OR WHT (MISCELLANEOUS) ×1
COVER SURGICAL LIGHT HANDLE (MISCELLANEOUS) ×4 IMPLANT
DRAPE DERMATAC (DRAPES) IMPLANT
DRAPE U-SHAPE 47X51 STRL (DRAPES) ×2 IMPLANT
DRESSING PEEL AND PLC PRVNA 13 (GAUZE/BANDAGES/DRESSINGS) IMPLANT
DRESSING PREVENA PLUS CUSTOM (GAUZE/BANDAGES/DRESSINGS) IMPLANT
DRSG ADAPTIC 3X8 NADH LF (GAUZE/BANDAGES/DRESSINGS) ×2 IMPLANT
DRSG PEEL AND PLACE PREVENA 13 (GAUZE/BANDAGES/DRESSINGS) ×1
DRSG PREVENA PLUS CUSTOM (GAUZE/BANDAGES/DRESSINGS)
DURAPREP 26ML APPLICATOR (WOUND CARE) ×2 IMPLANT
ELECT REM PT RETURN 9FT ADLT (ELECTROSURGICAL) ×1
ELECTRODE REM PT RTRN 9FT ADLT (ELECTROSURGICAL) IMPLANT
GAUZE SPONGE 4X4 12PLY STRL (GAUZE/BANDAGES/DRESSINGS) ×2 IMPLANT
GLOVE BIOGEL PI IND STRL 9 (GLOVE) ×2 IMPLANT
GLOVE SURG ORTHO 9.0 STRL STRW (GLOVE) ×2 IMPLANT
GOWN STRL REUS W/ TWL XL LVL3 (GOWN DISPOSABLE) ×4 IMPLANT
GOWN STRL REUS W/TWL XL LVL3 (GOWN DISPOSABLE) ×2
GRAFT SKIN WND MICRO 38 (Tissue) IMPLANT
HANDPIECE INTERPULSE COAX TIP (DISPOSABLE)
KIT BASIN OR (CUSTOM PROCEDURE TRAY) ×2 IMPLANT
KIT DRSG PREVENA PLUS 7DAY 125 (MISCELLANEOUS) IMPLANT
KIT TURNOVER KIT B (KITS) ×2 IMPLANT
MANIFOLD NEPTUNE II (INSTRUMENTS) ×2 IMPLANT
NDL 22X1.5 STRL (OR ONLY) (MISCELLANEOUS) IMPLANT
NEEDLE 22X1.5 STRL (OR ONLY) (MISCELLANEOUS) ×1 IMPLANT
NS IRRIG 1000ML POUR BTL (IV SOLUTION) ×2 IMPLANT
PACK ORTHO EXTREMITY (CUSTOM PROCEDURE TRAY) ×2 IMPLANT
PAD ARMBOARD 7.5X6 YLW CONV (MISCELLANEOUS) ×4 IMPLANT
SET HNDPC FAN SPRY TIP SCT (DISPOSABLE) IMPLANT
STOCKINETTE IMPERVIOUS 9X36 MD (GAUZE/BANDAGES/DRESSINGS) IMPLANT
SUT ETHILON 2 0 PSLX (SUTURE) ×2 IMPLANT
SWAB COLLECTION DEVICE MRSA (MISCELLANEOUS) ×2 IMPLANT
SWAB CULTURE ESWAB REG 1ML (MISCELLANEOUS) IMPLANT
SYR CONTROL 10ML LL (SYRINGE) IMPLANT
TOWEL GREEN STERILE (TOWEL DISPOSABLE) ×2 IMPLANT
TUBE CONNECTING 12X1/4 (SUCTIONS) ×2 IMPLANT
YANKAUER SUCT BULB TIP NO VENT (SUCTIONS) ×2 IMPLANT

## 2022-09-20 NOTE — Anesthesia Postprocedure Evaluation (Signed)
Anesthesia Post Note  Patient: Claudia Rivera  Procedure(s) Performed: RIGHT FOOT DEBRIDEMENT (Right: Foot)     Patient location during evaluation: PACU Anesthesia Type: General Level of consciousness: awake and alert, patient cooperative and oriented Pain management: pain level controlled Vital Signs Assessment: post-procedure vital signs reviewed and stable Respiratory status: spontaneous breathing, nonlabored ventilation and respiratory function stable Cardiovascular status: blood pressure returned to baseline and stable Postop Assessment: no apparent nausea or vomiting and adequate PO intake Anesthetic complications: no   No notable events documented.  Last Vitals:  Vitals:   09/20/22 0555 09/20/22 0755  BP: 133/81 125/61  Pulse: (!) 58   Resp: 16 12  Temp: 36.4 C 36.7 C  SpO2: 95% 100%    Last Pain:  Vitals:   09/20/22 0819  TempSrc:   PainSc: 6                  Peter Keyworth,E. Tanika Bracco

## 2022-09-20 NOTE — H&P (Signed)
Claudia Rivera is an 67 y.o. female.   Chief Complaint: Cellulitis and drainage right foot HPI: Patient is a 67 year old woman who presents for cellulitis fever and chills right foot. Patient is status post forefoot reconstruction in [place]. Patient had wound dehiscence after the initial surgery. This underwent repeat debridement and wound closure patient has been having swelling of the fourth toe cellulitis drainage and fevers in the afternoon low-grade around 99.   Past Medical History:  Diagnosis Date   Complication of anesthesia    Hypertension    Infection of breast implant ([place]) 03/01/2020   Menopause    Migraine    Dr. Erling Cruz   PONV (postoperative nausea and vomiting)    Raynaud's disease     Past Surgical History:  Procedure Laterality Date   BREAST ENHANCEMENT SURGERY  2012   CATARACT EXTRACTION Bilateral    DERMOID CYST  EXCISION  1980   EYE SURGERY Left    EYE SURGERY Bilateral    Lid sx   FOOT SURGERY Right    x 2   NECK SURGERY  2007   fusion C6/7    Family History  Problem Relation Age of Onset   COPD Mother    Cancer Father        prostate   Social History:  reports that she has never smoked. She has never used smokeless tobacco. She reports current alcohol use of about 3.0 standard drinks of alcohol per week. She reports that she does not use drugs.  Allergies:  Allergies  Allergen Reactions   Codeine Hives and Itching   Scopolamine Other (See Comments)    Dry eyes that led to scratched corneas.     Medications Prior to Admission  Medication Sig Dispense Refill   acetaminophen (TYLENOL) 650 MG CR tablet Take 1,300 mg by mouth in the morning and at bedtime.     cephALEXin (KEFLEX) 500 MG capsule Take 500 mg by mouth 4 (four) times daily.     doxycycline (VIBRA-TABS) 100 MG tablet Take 100 mg by mouth 2 (two) times daily.     Lactobacillus (PROBIOTIC ACIDOPHILUS PO) Take 1 capsule by mouth in the morning.     LEXAPRO 10 MG tablet TAKE 1 TABLET (10 MG  TOTAL) BY MOUTH DAILY. 90 tablet 3   losartan (COZAAR) 25 MG tablet Take 1 tablet (25 mg total) by mouth daily. 90 tablet 3   naproxen sodium (ALEVE) 220 MG tablet Take 440 mg by mouth in the morning and at bedtime.     nitroGLYCERIN (NITRODUR - DOSED IN MG/24 HR) 0.2 mg/hr patch Place 1 patch (0.2 mg total) onto the skin daily. (Patient taking differently: Place 0.25 patches onto the skin daily. Apply 0.25 patch to right leg once daily) 30 patch 12   Omega-3 Fatty Acids (FISH OIL PO) Take 1 capsule by mouth in the morning.     pentoxifylline (TRENTAL) 400 MG CR tablet Take 1 tablet (400 mg total) by mouth 3 (three) times daily with meals. 90 tablet 3   rosuvastatin (CRESTOR) 5 MG tablet Take 5 mg by mouth in the morning.     VYVANSE 40 MG capsule Take 40 mg by mouth every morning.     rizatriptan (MAXALT) 10 MG tablet TAKE 1 TABLET BY MOUTH AS NEEDED FOR MIGRAINE 30 tablet 5    No results found for this or any previous visit (from the past 48 hour(s)). No results found.  Review of Systems  All other systems reviewed and  are negative.   Blood pressure 133/81, pulse (!) 58, temperature 97.6 F (36.4 C), temperature source Oral, resp. rate 16, height '5\' 7"'$  (1.702 m), weight 52.2 kg, SpO2 95 %. Physical Exam  Patient is alert, oriented, no adenopathy, well-dressed, normal affect, normal respiratory effort. Examination patient has a palpable anterior tibial pulse and a palpable dorsalis pedis pulse.  She does not have a palpable pulse distal to the previous laceration.  The Doppler was used and patient has a multiphasic anterior tibial and posterior tibial pulse.  Patient has sausage digit swelling of the fourth toe there is some clear drainage from a few of the percutaneous wounds.  With elevation and massage the cellulitis does not resolve.   Review of her MRI scan did show intramedullary edema of the entire fourth metatarsal from a stress fracture.  Patient is status post fourth metatarsal  head resection as well as soft tissue reconstruction for the claw toes.  Patient did have a pin that extended down the shaft of the fourth metatarsal and this is concerning for possible deep bone infection to the fourth metatarsal. Assessment/Plan 1. Cellulitis of fourth toe of right foot       Plan: Will plan for repeat debridement of the fourth ray.  Plan to extend the previous surgical incision obtained tissue graft from the residual metatarsal as well as soft tissue.  Will place vancomycin powder and Kerecis tissue graft and a wound VAC.  Will start K[place]el and a nitroglycerin patch at this time.  She will continue her doxycycline.  Newt Minion, MD 09/20/2022, 6:22 AM

## 2022-09-20 NOTE — Anesthesia Procedure Notes (Signed)
Procedure Name: LMA Insertion Date/Time: 09/20/2022 7:22 AM  Performed by: Gwyndolyn Saxon, CRNAPre-anesthesia Checklist: Patient identified, Emergency Drugs available, Suction available and Patient being monitored Patient Re-evaluated:Patient Re-evaluated prior to induction Oxygen Delivery Method: Circle system utilized Preoxygenation: Pre-oxygenation with 100% oxygen Induction Type: IV induction Ventilation: Mask ventilation without difficulty LMA: LMA inserted LMA Size: 3.0 Number of attempts: 1 Placement Confirmation: positive ETCO2 and breath sounds checked- equal and bilateral Dental Injury: Teeth and Oropharynx as per pre-operative assessment

## 2022-09-20 NOTE — Op Note (Signed)
09/20/2022  7:48 AM  PATIENT:  Claudia Rivera    PRE-OPERATIVE DIAGNOSIS:  Cellulitis Right 4th Toe  POST-OPERATIVE DIAGNOSIS:  Same  PROCEDURE:  RIGHT FOOT DEBRIDEMENT with excision skin and soft tissue muscle fascia and bone. Application of vancomycin powder [id number] mg and application of Kerecis micro graft 38 cm. Local tissue rearrangement for wound closure 4 x 1 cm. Application of 13 cm Prevena wound VAC.  Patient received 1 g of vancomycin IV and 2 g Kefzol.   SURGEON:  Newt Minion, MD  PHYSICIAN ASSISTANT:None ANESTHESIA:   General  PREOPERATIVE INDICATIONS:  Claudia Rivera is a  67 y.o. female with a diagnosis of Cellulitis Right 4th Toe who failed conservative measures and elected for surgical management.    The risks benefits and alternatives were discussed with the patient preoperatively including but not limited to the risks of infection, bleeding, nerve injury, cardiopulmonary complications, the need for revision surgery, among others, and the patient was willing to proceed.  OPERATIVE IMPLANTS: Kerecis micro graft 38 cm and vancomycin powder 500 mg.  '@ENCIMAGES'$ @  OPERATIVE FINDINGS: Patient had ischemic muscle changes with the muscle color being gray there was fluid no purulence.    Soft tissue, nonviable tendon and ischemic bone was sent for cultures.  OPERATIVE PROCEDURE: Patient was brought the operative room and underwent a LMA anesthetic.  After adequate levels anesthesia obtained the right lower extremity was prepped using DuraPrep draped into a sterile field a timeout was called.  Patient underwent local infiltration with 10 cc of 1% lidocaine plain.  The previous dorsal incision was ellipsed out this tissue was sent for cultures.  This left a wound that was 1 x 4 cm.  Blunt dissection was carried down dorsally over the foot to the draining ulcer dorsally.  There was clear fluid.  There was ischemic changes to the interosseous muscle.  There was necrotic tendon that  was resected and sent for cultures.  The bone of the fourth metatarsal was white hard and sclerotic.  Bone was resected with a oscillating saw and sent for cultures.  The fourth toe was undermined with a freer elevator and there was no purulence.  The redness did resolve after decompression of the fluid in the fourth toe.  The wound was irrigated with normal saline tissue margins were clear.  The dorsum of the foot and wound was filled with 500 mg vancomycin powder.  The soft tissue was reinforced with 38 cm of Kerecis micro graft.  Skin was closed using 2-0 nylon a 13 cm Prevena wound VAC was applied this had a good suction fit patient was extubated taken the PACU in stable condition.   DISCHARGE PLANNING:  Antibiotic duration: Patient will continue with her doxycycline.  Weightbearing: Touchdown weightbearing on the right.  Pain medication: Patient requests no narcotic pain medicine.  Dressing care/ Wound VAC: Continue Prevena wound VAC.  Ambulatory devices: Recommended kneeling scooter.  Discharge to: Home.  Follow-up: In the office 1 week post operative.

## 2022-09-20 NOTE — Transfer of Care (Signed)
Immediate Anesthesia Transfer of Care Note  Patient: Claudia Rivera  Procedure(s) Performed: RIGHT FOOT DEBRIDEMENT (Right: Foot)  Patient Location: PACU  Anesthesia Type:General  Level of Consciousness: drowsy and patient cooperative  Airway & Oxygen Therapy: Patient Spontanous Breathing and Patient connected to nasal cannula oxygen  Post-op Assessment: Report given to RN and Post -op Vital signs reviewed and stable  Post vital signs: Reviewed and stable  Last Vitals:  Vitals Value Taken Time  BP 122/75 09/20/22 0800  Temp 36.7 C 09/20/22 0755  Pulse 63 09/20/22 0801  Resp 9 09/20/22 0801  SpO2 100 % 09/20/22 0801  Vitals shown include unvalidated device data.  Last Pain:  Vitals:   09/20/22 0609  TempSrc:   PainSc: 3       Patients Stated Pain Goal: 1 ([id number])  Complications: No notable events documented.

## 2022-09-21 ENCOUNTER — Encounter (HOSPITAL_COMMUNITY): Payer: Self-pay | Admitting: Orthopedic Surgery

## 2022-09-25 LAB — AEROBIC/ANAEROBIC CULTURE W GRAM STAIN (SURGICAL/DEEP WOUND)
Culture: NO GROWTH
Gram Stain: NONE SEEN

## 2022-09-26 ENCOUNTER — Ambulatory Visit: Payer: No Typology Code available for payment source | Admitting: Orthopedic Surgery

## 2022-09-26 ENCOUNTER — Encounter: Payer: Self-pay | Admitting: Orthopedic Surgery

## 2022-09-26 ENCOUNTER — Telehealth: Payer: Self-pay | Admitting: Orthopedic Surgery

## 2022-09-26 DIAGNOSIS — L03031 Cellulitis of right toe: Secondary | ICD-10-CM

## 2022-09-26 NOTE — Progress Notes (Signed)
Office Visit Note   Patient: Claudia Rivera           Date of Birth: January 05, 1956           MRN: [id number] Visit Date: 09/26/2022              Requested by: Chesley Noon, MD [place],  [place] [id number] PCP: Chesley Noon, MD  Chief Complaint  Patient presents with   Right Foot - Routine Post Op    09-20-22 right foot debridement      HPI: Patient is a 67 year old woman who is seen in follow-up status post debridement right foot with excision of necrotic tendon and soft tissue and avascular necrosis of the fourth metatarsal.  Patient has been in the wound VAC had Kerecis 38 cm of micro graft applied with vancomycin powder.  Cultures are negative.  Assessment & Plan: Visit Diagnoses:  1. Cellulitis of fourth toe of right foot     Plan: Patient was given a Vive sock 2-week between her toes to decrease the maceration and she was given a large stump shrinker to wear as a sock.  Continue with a kneeling scooter and protected weightbearing start Dial soap cleansing.  Follow-Up Instructions: Return in about 1 week (around 10/03/2022).   Ortho Exam  Patient is alert, oriented, no adenopathy, well-dressed, normal affect, normal respiratory effort. Examination the wound shows excellent healing of the incision, swelling of the foot and cellulitis of the fourth toe has resolved, there is some maceration between the toes, there is good wrinkling of the skin.  The draining ulcer over the dorsum of the second toe has resolved.  Imaging: No results found.   Labs: Lab Results  Component Value Date   ESRSEDRATE 2 09/20/2022   CRP <0.5 09/20/2022   CRP <0.02 06/26/2011   REPTSTATUS 09/25/2022 FINAL 09/20/2022   GRAMSTAIN NO WBC SEEN NO ORGANISMS SEEN  09/20/2022   CULT  09/20/2022    No growth aerobically or anaerobically. Performed at [facility], [place] [address] [place], [place] [address]    LABORGA STAPHYLOCOCCUS AUREUS 07/30/2010      Lab Results  Component Value Date   ALBUMIN 3.6 09/20/2022   ALBUMIN 4.1 04/11/2014   ALBUMIN 4.3 01/21/2013    No results found for: "MG" Lab Results  Component Value Date   VD25OH 47 04/11/2014   VD25OH 45 01/21/2013    No results found for: "PREALBUMIN"    Latest Ref Rng & Units 09/20/2022    5:40 AM 04/11/2014    9:40 AM 01/21/2013    2:14 PM  CBC EXTENDED  WBC 4.0 - 10.5 K/uL 6.2  5.8  6.9   RBC 3.87 - 5.11 MIL/uL 4.42  4.66  4.87   Hemoglobin 12.0 - 15.0 g/dL 14.3  14.4  14.5   HCT 36.0 - 46.0 % 42.4  41.8  42.7   Platelets 150 - 400 K/uL 167  190  192   NEUT# 1.7 - 7.7 K/uL 3.6  3.2  4.3   Lymph# 0.7 - 4.0 K/uL 1.6  1.9  1.9      There is no height or weight on file to calculate BMI.  Orders:  No orders of the defined types were placed in this encounter.  No orders of the defined types were placed in this encounter.    Procedures: No procedures performed  Clinical Data: No additional findings.  ROS:  All other systems negative,  except as noted in the HPI. Review of Systems  Objective: Vital Signs: There were no vitals taken for this visit.  Specialty Comments:  No specialty comments available.  PMFS History: Patient Active Problem List   Diagnosis Date Noted   Cellulitis of fourth toe of right foot 09/20/2022   Wound dehiscence, surgical 09/20/2022   Hordeolum internum of right lower eyelid 02/10/2022   Acute sinusitis 03/01/2020   Infection of breast implant ([place]) 03/01/2020   Rosacea blepharoconjunctivitis 01/26/2020   Contact dermatitis of left eyelid 01/26/2020   Encounter for counseling 11/09/2019   Right flank pain 08/15/2013   OAB (overactive bladder) 08/15/2013   Osteopenia 09/12/2011   Underweight 03/19/2011   Migraine    Menopause    Past Medical History:  Diagnosis Date   Complication of anesthesia    Hypertension    Infection of breast implant ([place]) 03/01/2020   Menopause    Migraine    Dr. Erling Cruz   PONV  (postoperative nausea and vomiting)    Raynaud's disease     Family History  Problem Relation Age of Onset   COPD Mother    Cancer Father        prostate    Past Surgical History:  Procedure Laterality Date   BREAST ENHANCEMENT SURGERY  2012   CATARACT EXTRACTION Bilateral    DERMOID CYST  EXCISION  1980   EYE SURGERY Left    EYE SURGERY Bilateral    Lid sx   FOOT SURGERY Right    x 2   I & D EXTREMITY Right 09/20/2022   Procedure: RIGHT FOOT DEBRIDEMENT;  Surgeon: Newt Minion, MD;  Location: [place];  Service: Orthopedics;  Laterality: Right;   NECK SURGERY  2007   fusion C6/7   Social History   Occupational History   Not on file  Tobacco Use   Smoking status: Never   Smokeless tobacco: Never  Vaping Use   Vaping Use: Never used  Substance and Sexual Activity   Alcohol use: Yes    Alcohol/week: 3.0 standard drinks of alcohol    Types: 3 Standard drinks or equivalent per week   Drug use: No   Sexual activity: Yes    Birth control/protection: Post-menopausal

## 2022-09-26 NOTE — Telephone Encounter (Signed)
Can you please call. He has a meeting at 8 am on Tuesday she can come in at 10 am to see Dr. Sharol Given will you ask if they will come then please?

## 2022-09-26 NOTE — Telephone Encounter (Signed)
Removed appt from Erin's sch and made appt with Dr. Sharol Given for that same day.

## 2022-09-26 NOTE — Telephone Encounter (Signed)
Patient's husband said he needs an appointment with Sharol Given Tuesday at 8:15. No openings on the schedule. Did not want to be seen by NPA

## 2022-10-01 ENCOUNTER — Encounter: Payer: No Typology Code available for payment source | Admitting: Family

## 2022-10-01 ENCOUNTER — Telehealth: Payer: Self-pay | Admitting: Orthopedic Surgery

## 2022-10-01 ENCOUNTER — Encounter: Payer: Self-pay | Admitting: Orthopedic Surgery

## 2022-10-01 ENCOUNTER — Ambulatory Visit (INDEPENDENT_AMBULATORY_CARE_PROVIDER_SITE_OTHER): Payer: No Typology Code available for payment source | Admitting: Orthopedic Surgery

## 2022-10-01 DIAGNOSIS — L03031 Cellulitis of right toe: Secondary | ICD-10-CM

## 2022-10-01 NOTE — Progress Notes (Signed)
Office Visit Note   Patient: Claudia Rivera           Date of Birth: May 20, 1956           MRN: [id number] Visit Date: 10/01/2022              Requested by: Chesley Noon, MD [address] [place],  [place] [id number] PCP: Chesley Noon, MD  Chief Complaint  Patient presents with   Right Foot - Routine Post Op    09/20/2022 right foot debridement       HPI: Patient is a 67 year old woman who was seen in follow-up for debridement right foot.  Assessment & Plan: Visit Diagnoses:  1. Cellulitis of fourth toe of right foot     Plan: Patient is showing some changes with lymphedema.  She will work on massage elevation continue protected weightbearing continue with the compression sock and follow-up in 2 weeks to remove the sutures.  Follow-Up Instructions: Return in about 2 weeks (around 10/15/2022).   Ortho Exam  Patient is alert, oriented, no adenopathy, well-dressed, normal affect, normal respiratory effort. Examination photos of the patient's foot at the end of today shows increased redness and swelling.  Examination this morning there is some mild redness and swelling around the incision.  With elevation and massage this does resolve.  The redness from the fourth toe is completely resolved though there is still some swelling.  The drainage from the incision and drainage from the percutaneous incision from her previous tendon surgery has resolved.  No signs of infection she will discontinue her antibiotics.  She has a strong posterior tibial pulse.  Imaging: No results found. No images are attached to the encounter.  Labs: Lab Results  Component Value Date   ESRSEDRATE 2 09/20/2022   CRP <0.5 09/20/2022   CRP <0.02 06/26/2011   REPTSTATUS 09/25/2022 FINAL 09/20/2022   GRAMSTAIN NO WBC SEEN NO ORGANISMS SEEN  09/20/2022   CULT  09/20/2022    No growth aerobically or anaerobically. Performed at [facility], [place] [address], [place], [place]  [address]    LABORGA STAPHYLOCOCCUS AUREUS 07/30/2010     Lab Results  Component Value Date   ALBUMIN 3.6 09/20/2022   ALBUMIN 4.1 04/11/2014   ALBUMIN 4.3 01/21/2013    No results found for: "MG" Lab Results  Component Value Date   VD25OH 47 04/11/2014   VD25OH 45 01/21/2013    No results found for: "PREALBUMIN"    Latest Ref Rng & Units 09/20/2022    5:40 AM 04/11/2014    9:40 AM 01/21/2013    2:14 PM  CBC EXTENDED  WBC 4.0 - 10.5 K/uL 6.2  5.8  6.9   RBC 3.87 - 5.11 MIL/uL 4.42  4.66  4.87   Hemoglobin 12.0 - 15.0 g/dL 14.3  14.4  14.5   HCT 36.0 - 46.0 % 42.4  41.8  42.7   Platelets 150 - 400 K/uL 167  190  192   NEUT# 1.7 - 7.7 K/uL 3.6  3.2  4.3   Lymph# 0.7 - 4.0 K/uL 1.6  1.9  1.9      There is no height or weight on file to calculate BMI.  Orders:  No orders of the defined types were placed in this encounter.  No orders of the defined types were placed in this encounter.    Procedures: No procedures performed  Clinical Data: No additional findings.  ROS:  All other  systems negative, except as noted in the HPI. Review of Systems  Objective: Vital Signs: There were no vitals taken for this visit.  Specialty Comments:  No specialty comments available.  PMFS History: Patient Active Problem List   Diagnosis Date Noted   Cellulitis of fourth toe of right foot 09/20/2022   Wound dehiscence, surgical 09/20/2022   Hordeolum internum of right lower eyelid 02/10/2022   Acute sinusitis 03/01/2020   Infection of breast implant ([place]) 03/01/2020   Rosacea blepharoconjunctivitis 01/26/2020   Contact dermatitis of left eyelid 01/26/2020   Encounter for counseling 11/09/2019   Right flank pain 08/15/2013   OAB (overactive bladder) 08/15/2013   Osteopenia 09/12/2011   Underweight 03/19/2011   Migraine    Menopause    Past Medical History:  Diagnosis Date   Complication of anesthesia    Hypertension    Infection of breast implant ([place]) 03/01/2020    Menopause    Migraine    Dr. Erling Cruz   PONV (postoperative nausea and vomiting)    Raynaud's disease     Family History  Problem Relation Age of Onset   COPD Mother    Cancer Father        prostate    Past Surgical History:  Procedure Laterality Date   BREAST ENHANCEMENT SURGERY  2012   CATARACT EXTRACTION Bilateral    DERMOID CYST  EXCISION  1980   EYE SURGERY Left    EYE SURGERY Bilateral    Lid sx   FOOT SURGERY Right    x 2   I & D EXTREMITY Right 09/20/2022   Procedure: RIGHT FOOT DEBRIDEMENT;  Surgeon: Newt Minion, MD;  Location: [place];  Service: Orthopedics;  Laterality: Right;   NECK SURGERY  2007   fusion C6/7   Social History   Occupational History   Not on file  Tobacco Use   Smoking status: Never   Smokeless tobacco: Never  Vaping Use   Vaping Use: Never used  Substance and Sexual Activity   Alcohol use: Yes    Alcohol/week: 3.0 standard drinks of alcohol    Types: 3 Standard drinks or equivalent per week   Drug use: No   Sexual activity: Yes    Birth control/protection: Post-menopausal

## 2022-10-01 NOTE — Telephone Encounter (Signed)
This has been done.

## 2022-10-01 NOTE — Telephone Encounter (Signed)
Patient requesting an appointment on April 2nd at 8:15..please advise . only want to see Sharol Given

## 2022-10-15 ENCOUNTER — Ambulatory Visit (INDEPENDENT_AMBULATORY_CARE_PROVIDER_SITE_OTHER): Payer: No Typology Code available for payment source | Admitting: Orthopedic Surgery

## 2022-10-15 ENCOUNTER — Encounter: Payer: Self-pay | Admitting: Orthopedic Surgery

## 2022-10-15 DIAGNOSIS — L03031 Cellulitis of right toe: Secondary | ICD-10-CM

## 2022-10-15 NOTE — Progress Notes (Signed)
Office Visit Note   Patient: Claudia Rivera           Date of Birth: [birth year]           MRN: [id number] Visit Date: 10/15/2022              Requested by: Chesley Noon, MD [address] [place],  [place] [address] PCP: Chesley Noon, MD  Chief Complaint  Patient presents with   Right Foot - Routine Post Op    09/20/2022 right foot debridement       HPI: Patient is a 66 year old woman who is status post debridement wound dehiscence after forefoot reconstruction.  Assessment & Plan: Visit Diagnoses:  1. Cellulitis of fourth toe of right foot     Plan: Continue with the compression sock, scar massage sutures harvested today.  Follow-Up Instructions: No follow-ups on file.   Ortho Exam  Patient is alert, oriented, no adenopathy, well-dressed, normal affect, normal respiratory effort. Examination there is still some swelling of the wounds are well-healed there is no drainage no cellulitis.  Patient does have decreased capillary refill throughout the forefoot.  Imaging: No results found. No images are attached to the encounter.  Labs: Lab Results  Component Value Date   ESRSEDRATE 2 09/20/2022   CRP <0.5 09/20/2022   CRP <0.02 06/26/2011   REPTSTATUS 09/25/2022 FINAL 09/20/2022   GRAMSTAIN NO WBC SEEN NO ORGANISMS SEEN  09/20/2022   CULT  09/20/2022    No growth aerobically or anaerobically. Performed at [facility], [place] [address] [place], [place] [address]    LAB[facility]GA STAPHYLOCOCCUS AUREUS 07/30/2010     Lab Results  Component Value Date   ALBUMIN 3.6 09/20/2022   ALBUMIN 4.1 04/11/2014   ALBUMIN 4.3 01/21/2013    No results found for: "MG" Lab Results  Component Value Date   VD25OH 47 04/11/2014   VD25OH 45 01/21/2013    No results found for: "PREALBUMIN"    Latest Ref Rng & Units 09/20/2022    5:40 AM 04/11/2014    9:40 AM 01/21/2013    2:14 PM  CBC EXTENDED  WBC 4.0 - 10.5 K/uL 6.2  5.8  6.9   RBC 3.87 - 5.11  MIL/uL 4.42  4.66  4.87   Hemoglobin 12.0 - 15.0 g/dL 14.3  14.4  14.5   HCT 36.0 - 46.0 % 42.4  41.8  42.7   Platelets 150 - 400 K/uL 167  190  192   NEUT# 1.7 - 7.7 K/uL 3.6  3.2  4.3   Lymph# 0.7 - 4.0 K/uL 1.6  1.9  1.9      There is no height or weight on file to calculate BMI.  Orders:  No orders of the defined types were placed in this encounter.  No orders of the defined types were placed in this encounter.    Procedures: No procedures performed  Clinical Data: No additional findings.  ROS:  All other systems negative, except as noted in the HPI. Review of Systems  Objective: Vital Signs: There were no vitals taken for this visit.  Specialty Comments:  No specialty comments available.  PMFS History: Patient Active Problem List   Diagnosis Date Noted   Cellulitis of fourth toe of right foot 09/20/2022   Wound dehiscence, surgical 09/20/2022   Hordeolum internum of right lower eyelid 02/10/2022   Acute sinusitis 03/01/2020   Infection of breast implant 03/01/2020   Rosacea blepharoconjunctivitis 01/26/2020  Contact dermatitis of left eyelid 01/26/2020   Encounter for counseling 11/09/2019   Right flank pain 08/15/2013   OAB (overactive bladder) 08/15/2013   Osteopenia 09/12/2011   Underweight 03/19/2011   Migraine    Menopause    Past Medical History:  Diagnosis Date   Complication of anesthesia    Hypertension    Infection of breast implant 03/01/2020   Menopause    Migraine    Dr. Erling Cruz   PONV (postoperative nausea and vomiting)    Raynaud's disease     Family History  Problem Relation Age of Onset   COPD Mother    Cancer Father        prostate    Past Surgical History:  Procedure Laterality Date   BREAST ENHANCEMENT SURGERY  2012   CATARACT EXTRACTION Bilateral    DERMOID CYST  EXCISION  1980   EYE SURGERY Left    EYE SURGERY Bilateral    Lid sx   FOOT SURGERY Right    x 2   I & D EXTREMITY Right 09/20/2022   Procedure: RIGHT FOOT  DEBRIDEMENT;  Surgeon: Newt Minion, MD;  Location: [facility];  Service: Orthopedics;  Laterality: Right;   NECK SURGERY  2007   fusion C6/7   Social History   Occupational History   Not on file  Tobacco Use   Smoking status: Never   Smokeless tobacco: Never  Vaping Use   Vaping Use: Never used  Substance and Sexual Activity   Alcohol use: Yes    Alcohol/week: 3.0 standard drinks of alcohol    Types: 3 Standard drinks or equivalent per week   Drug use: No   Sexual activity: Yes    Birth control/protection: Post-menopausal

## 2022-10-21 ENCOUNTER — Ambulatory Visit (INDEPENDENT_AMBULATORY_CARE_PROVIDER_SITE_OTHER): Payer: No Typology Code available for payment source | Admitting: Orthopedic Surgery

## 2022-10-21 DIAGNOSIS — L03031 Cellulitis of right toe: Secondary | ICD-10-CM

## 2022-11-01 ENCOUNTER — Encounter: Payer: Self-pay | Admitting: Orthopedic Surgery

## 2022-11-01 NOTE — Progress Notes (Signed)
Office Visit Note   Patient: Claudia Rivera           Date of Birth: Mar 12, 1956           MRN: [id number] Visit Date: 10/21/2022              Requested by: [name], MD [address] [place],  [place] [address] PCP: [name], MD  Chief Complaint  Patient presents with   Right Foot - Routine Post Op    09/20/2022 right foot debridement       HPI: Patient is a 66 year old woman who is seen 4 weeks status post debridement for cellulitis and wound dehiscence right foot.  Assessment & Plan: Visit Diagnoses:  1. Cellulitis of fourth toe of right foot     Plan: Patient will continue with compression and elevation.  She wishes to follow-up as needed.  Follow-Up Instructions: No follow-ups on file.   Ortho Exam  Patient is alert, oriented, no adenopathy, well-dressed, normal affect, normal respiratory effort. Examination there is been some slight wound dehiscence.  The swelling of the toes has decreased the medial wound is dry without drainage.  Imaging: No results found.   Labs: Lab Results  Component Value Date   ESRSEDRATE 2 09/20/2022   CRP <0.5 09/20/2022   CRP <0.02 06/26/2011   REPTSTATUS 09/25/2022 FINAL 09/20/2022   GRAMSTAIN NO WBC SEEN NO ORGANISMS SEEN  09/20/2022   CULT  09/20/2022    No growth aerobically or anaerobically. Performed at [facility], [address], [place], [place] [address]    [facility] STAPHYLOCOCCUS AUREUS 07/30/2010     Lab Results  Component Value Date   ALBUMIN 3.6 09/20/2022   ALBUMIN 4.1 04/11/2014   ALBUMIN 4.3 01/21/2013    No results found for: "MG" Lab Results  Component Value Date   VD25OH 47 04/11/2014   VD25OH 45 01/21/2013    No results found for: "PREALBUMIN"    Latest Ref Rng & Units 09/20/2022    5:40 AM 04/11/2014    9:40 AM 01/21/2013    2:14 PM  CBC EXTENDED  WBC 4.0 - 10.5 K/uL 6.2  5.8  6.9   RBC 3.87 - 5.11 MIL/uL 4.42  4.66  4.87   Hemoglobin 12.0 - 15.0 g/dL  62.1  30.8  65.7   HCT 36.0 - 46.0 % 42.4  41.8  42.7   Platelets 150 - 400 K/uL 167  190  192   NEUT# 1.7 - 7.7 K/uL 3.6  3.2  4.3   Lymph# 0.7 - 4.0 K/uL 1.6  1.9  1.9      There is no height or weight on file to calculate BMI.  Orders:  No orders of the defined types were placed in this encounter.  No orders of the defined types were placed in this encounter.    Procedures: No procedures performed  Clinical Data: No additional findings.  ROS:  All other systems negative, except as noted in the HPI. Review of Systems  Objective: Vital Signs: There were no vitals taken for this visit.  Specialty Comments:  No specialty comments available.  PMFS History: Patient Active Problem List   Diagnosis Date Noted   Cellulitis of fourth toe of right foot 09/20/2022   Wound dehiscence, surgical 09/20/2022   Hordeolum internum of right lower eyelid 02/10/2022   Acute sinusitis 03/01/2020   Infection of breast implant 03/01/2020   Rosacea blepharoconjunctivitis 01/26/2020   Contact  dermatitis of left eyelid 01/26/2020   Encounter for counseling 11/09/2019   Right flank pain 08/15/2013   OAB (overactive bladder) 08/15/2013   Osteopenia 09/12/2011   Underweight 03/19/2011   Migraine    Menopause    Past Medical History:  Diagnosis Date   Complication of anesthesia    Hypertension    Infection of breast implant 03/01/2020   Menopause    Migraine    Dr. Sandria Manly   PONV (postoperative nausea and vomiting)    Raynaud's disease     Family History  Problem Relation Age of Onset   COPD Mother    Cancer Father        prostate    Past Surgical History:  Procedure Laterality Date   BREAST ENHANCEMENT SURGERY  2012   CATARACT EXTRACTION Bilateral    DERMOID CYST  EXCISION  1980   EYE SURGERY Left    EYE SURGERY Bilateral    Lid sx   FOOT SURGERY Right    x 2   I & D EXTREMITY Right 09/20/2022   Procedure: RIGHT FOOT DEBRIDEMENT;  Surgeon: Nadara Mustard, MD;  Location:  [facility] OR;  Service: Orthopedics;  Laterality: Right;   NECK SURGERY  2007   fusion C6/7   Social History   Occupational History   Not on file  Tobacco Use   Smoking status: Never   Smokeless tobacco: Never  Vaping Use   Vaping Use: Never used  Substance and Sexual Activity   Alcohol use: Yes    Alcohol/week: 3.0 standard drinks of alcohol    Types: 3 Standard drinks or equivalent per week   Drug use: No   Sexual activity: Yes    Birth control/protection: Post-menopausal

## 2022-11-19 ENCOUNTER — Encounter: Payer: Self-pay | Admitting: Orthopedic Surgery

## 2022-11-19 ENCOUNTER — Ambulatory Visit (INDEPENDENT_AMBULATORY_CARE_PROVIDER_SITE_OTHER): Payer: No Typology Code available for payment source | Admitting: Orthopedic Surgery

## 2022-11-19 DIAGNOSIS — M79671 Pain in right foot: Secondary | ICD-10-CM

## 2022-11-19 NOTE — Progress Notes (Signed)
Patient is seen in follow-up status post debridement for infection from a previous wound dehiscence for forefoot reconstruction.  Patient complains of a ulcer in the first webspace second toe from overlapping of the great toe and pain in the second toe.  Examination there is swelling of the second toe but no cellulitis there is a area on the first webspace medial border of the PIP joint that is developing from pressure from the second toe it is about 2 mm in diameter.  Patient has slow capillary refill in her foot.  She will continue with the Trental about 1 a day and the nitroglycerin patch over the posterior tibial artery.  She is given a small fill pad to place in the first webspace to prevent overlapping of the great toe on the second toe.

## 2023-01-19 IMAGING — MR MR BREAST BILAT W/O CM
6 of 9 series · 22 of 48 positions shown · non-contrast
Comparison: Previous exam(s).

CLINICAL DATA: Implant rupture protocol questionable findings on a
prior mammogram and MRI. Her screening mammogram of from 11/24/2019
indicated a new focal bulge in the superomedial left implant. She
had a MRI to evaluate for implant rupture on 12/14/2019, which
demonstrated concern for intracapsular rupture of the left implant.

EXAM:
BILATERAL BREAST MRI  WITHOUT CONTRAST
TECHNIQUE: Multiplanar, multisequence MR images of both breasts without
contrast. The exam was used to evaluate implant integrity. Because
of the sequences used and the lack of intravenous contrast, this
study is not diagnostic for breast lesions.

[Series 2: STIR · axial · 4.0mm · 0.70mm/px · z∈[-123,+89]mm · 4 of 54 slices shown (1 of 3)]
[im 1/54]
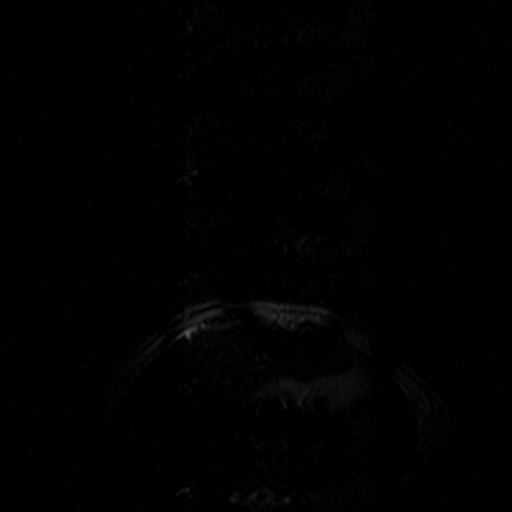
[im 18/54]
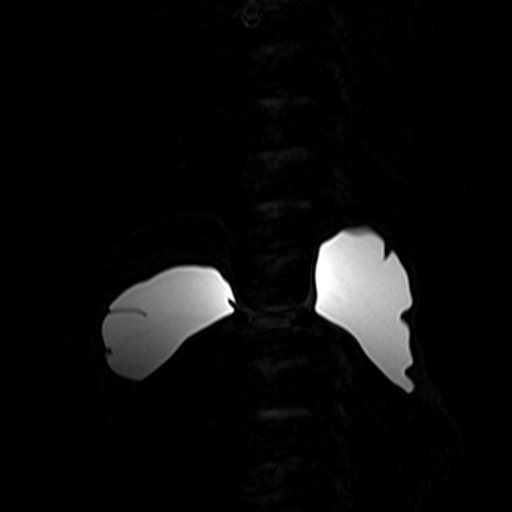
[im 36/54]
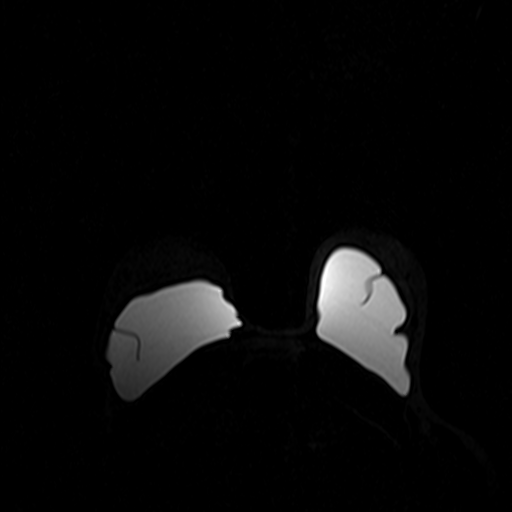
[im 54/54]
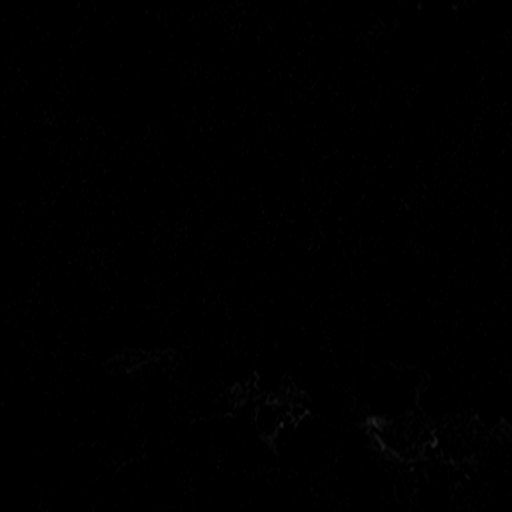

[Series 3: t2_tirm_tra fs ipat · axial · 4.0mm · 0.68mm/px · z∈[-135,+101]mm · 4 of 60 slices shown]
[im 1/60]
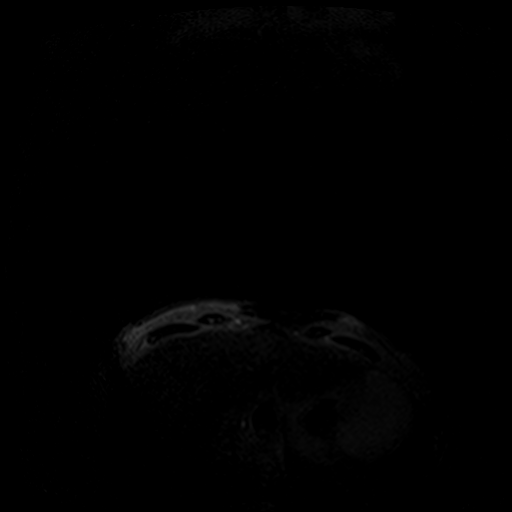
[im 20/60]
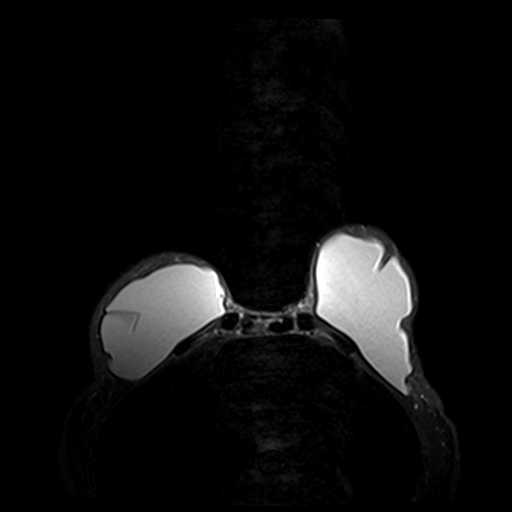
[im 40/60]
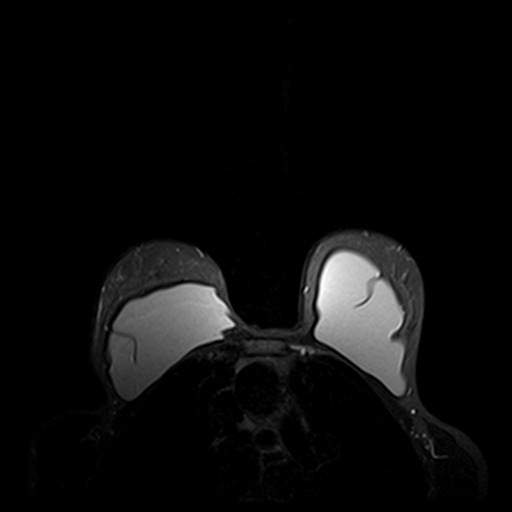
[im 60/60]
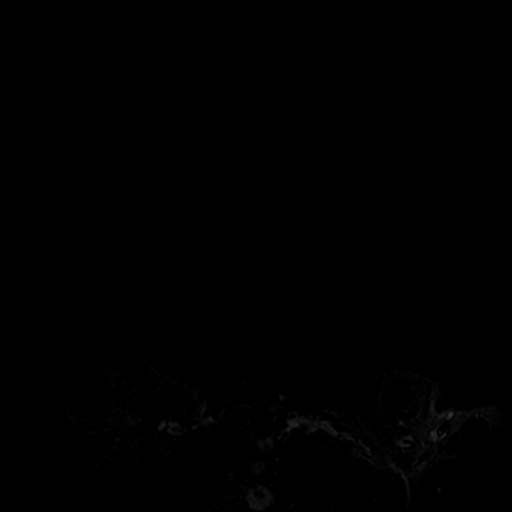

[Series 4: T1 fat-sat · axial · 1.2mm · 0.91mm/px · z∈[-132,+97]mm · 8 of 192 slices shown]
[im 1/192]
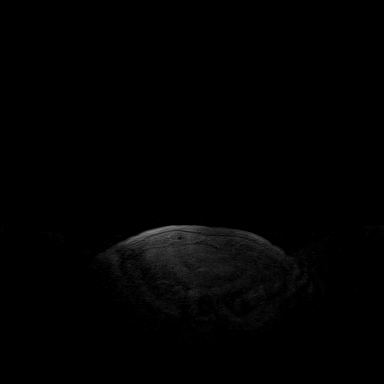
[im 30/192]
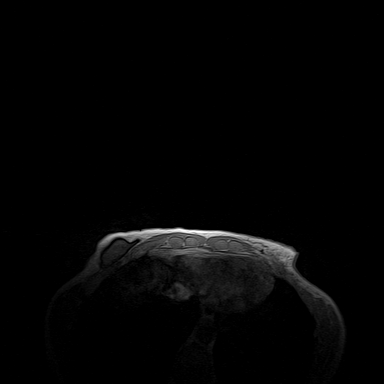
[im 59/192]
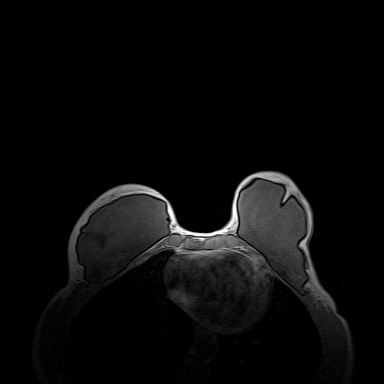
[im 89/192]
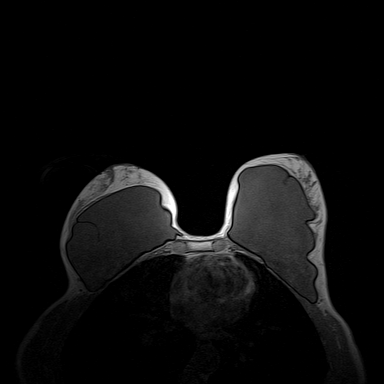
[im 103/192]
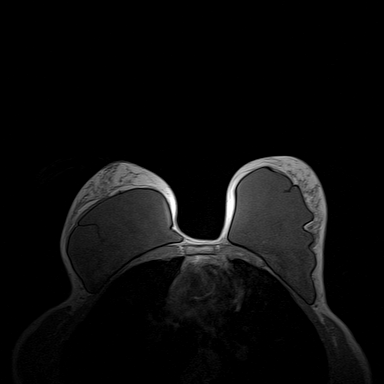
[im 133/192]
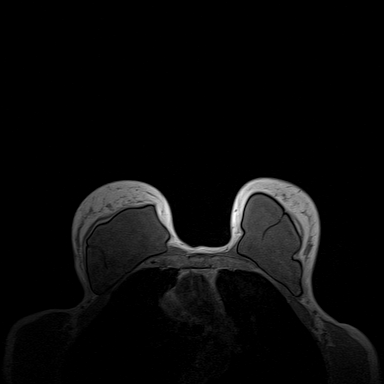
[im 162/192]
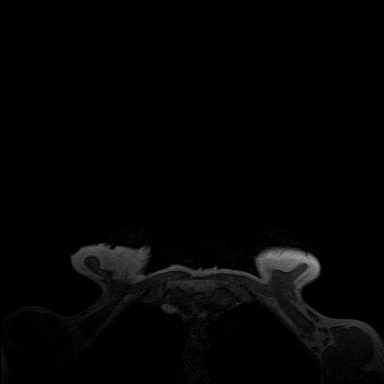
[im 192/192]
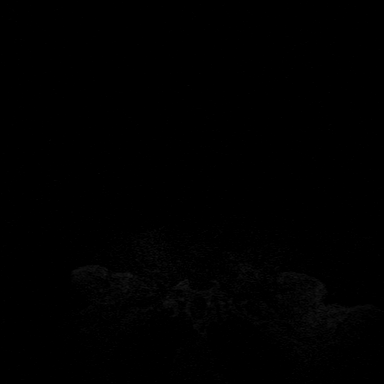

[Series 5: STIR · sagittal · 4.0mm · 0.47mm/px · 3 of 35 slices shown (2 of 3)]
[im 1/35]
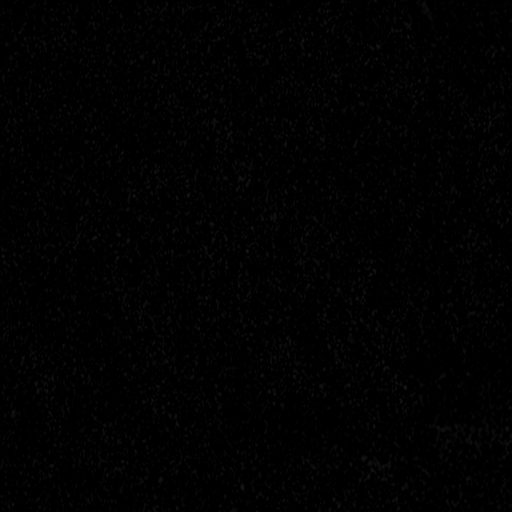
[im 18/35]
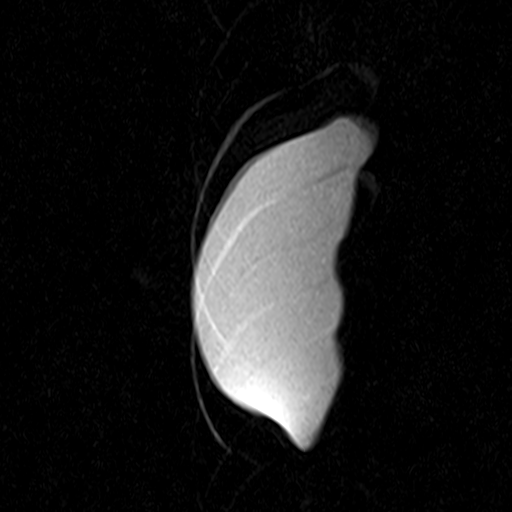
[im 35/35]
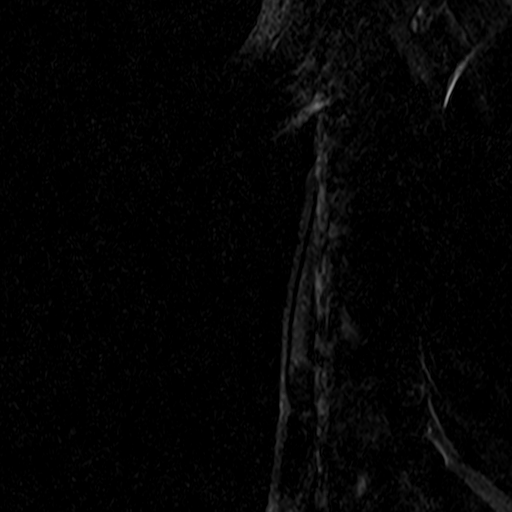

[Series 6: STIR · sagittal · 4.0mm · 0.47mm/px · 2 of 29 slices shown (3 of 3)]
[im 1/29]
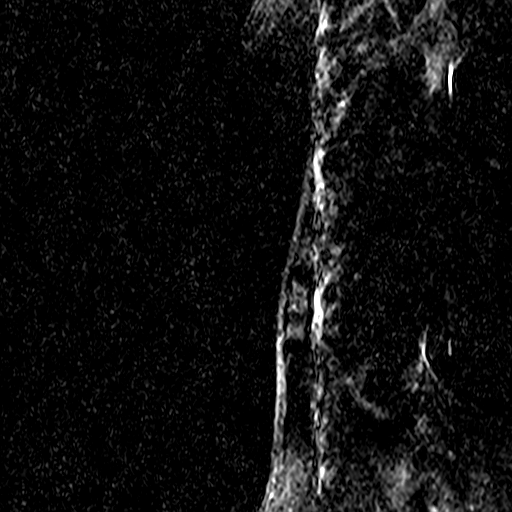
[im 29/29]
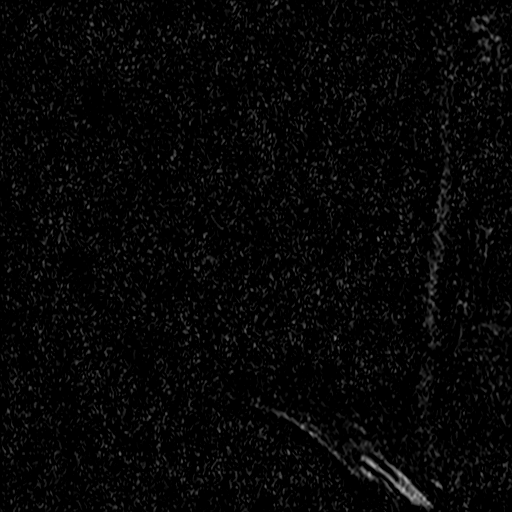

[Series 7: T2 fat-sat · sagittal · 4.0mm · 0.47mm/px · 1 of 28 slices shown]
[im 1/28]
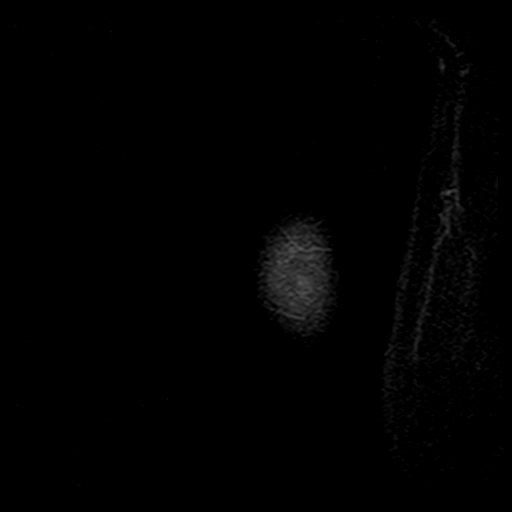

[22 of 48 positions shown; findings below may reference images not displayed]

Three-dimensional MR images were rendered by post-processing of the
original MR data on an independent workstation. The
three-dimensional MR images were interpreted, and findings are
reported in the following complete MRI report for this study. Three
dimensional images were evaluated at the independent [employer]
workstation
FINDINGS: Breast composition: c. Heterogeneous fibroglandular tissue.

There are bilateral retropectoral silicone implants.

Right breast: No extracapsular silicone is identified. No findings
of intracapsular rupture.

Left breast: No extracapsular silicone is identified. No findings of
intracapsular rupture. The foci of water signal, and the keyhole
signs seen previously are not reproduced on this exam.

Ancillary findings: None.
IMPRESSION: 1. No definitive evidence of intracapsular or extracapsular silicone
implant rupture in either breast.

RECOMMENDATION:
1.  Clinical follow-up recommended for management of the implants.

2. The patient is due for bilateral screening mammography [REDACTED] of

BI-RADS 1: Normal.

## 2023-02-04 ENCOUNTER — Other Ambulatory Visit: Payer: Self-pay | Admitting: Orthopedic Surgery

## 2023-03-18 ENCOUNTER — Encounter: Payer: Self-pay | Admitting: Sports Medicine

## 2023-03-18 ENCOUNTER — Other Ambulatory Visit: Payer: Self-pay | Admitting: Pharmacy Technician

## 2023-03-18 ENCOUNTER — Telehealth: Payer: Self-pay | Admitting: Pharmacy Technician

## 2023-03-18 DIAGNOSIS — M810 Age-related osteoporosis without current pathological fracture: Secondary | ICD-10-CM | POA: Insufficient documentation

## 2023-03-18 NOTE — Telephone Encounter (Signed)
Auth Submission: NO AUTH NEEDED Site of care: Site of care: [facility] Payer: [employer] Medication & CPT/J Code(s) submitted: Reclast (Zolendronic acid) [id number] Route of submission (phone, fax, portal):  Phone # Fax # Auth type: Buy/Bill PB Units/visits requested: X1 Reference number:  Approval from: 03/18/23 to 07/15/23

## 2023-04-04 ENCOUNTER — Ambulatory Visit: Payer: No Typology Code available for payment source

## 2023-04-14 ENCOUNTER — Ambulatory Visit (INDEPENDENT_AMBULATORY_CARE_PROVIDER_SITE_OTHER): Payer: No Typology Code available for payment source | Admitting: Orthopedic Surgery

## 2023-04-14 ENCOUNTER — Encounter: Payer: Self-pay | Admitting: Orthopedic Surgery

## 2023-04-14 DIAGNOSIS — M79671 Pain in right foot: Secondary | ICD-10-CM | POA: Diagnosis not present

## 2023-04-14 NOTE — Progress Notes (Signed)
Office Visit Note   Patient: Claudia Rivera           Date of Birth: July 21, 1955           MRN: [id number] Visit Date: 04/14/2023              Requested by: [name], MD [address] [place],  [place] [address] PCP: [name], MD  Chief Complaint  Patient presents with   Right Foot - Pain      HPI: Patient is a 67 year old woman who is seen for evaluation for pain beneath the second and third metatarsals.  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  Assessment & Plan: Visit Diagnoses:  1. Right foot pain     Plan: Recommended sole orthotics with a met pad and cork.  Recommended Voltaren gel topically and a stiff soled sneaker with a carbon plate.  Recommended Achilles stretching.  Follow-Up Instructions: No follow-ups on file.   Ortho Exam  Patient is alert, oriented, no adenopathy, well-dressed, normal affect, normal respiratory effort. Examination patient does have swelling around the MTP joint of the second and third toe.  The metatarsal heads are palpable and prominent.  Patient has Achilles tightness and she is overloading the metatarsal heads.  She is not a candidate for Weil osteotomies.  There is no redness no cellulitis no signs of infection.  There is fixed clawing of the second toe with a mucoid cyst at the PIP joint from arthritic changes.  Imaging: No results found. No images are attached to the encounter.  Labs: Lab Results  Component Value Date   ESRSEDRATE 2 09/20/2022   CRP <0.5 09/20/2022   CRP <0.02 06/26/2011   REPTSTATUS 09/25/2022 FINAL 09/20/2022   GRAMSTAIN NO WBC SEEN NO ORGANISMS SEEN  09/20/2022   CULT  09/20/2022    No growth aerobically or anaerobically. Performed at [facility], [address] [place], [place] [address]    [facility] STAPHYLOCOCCUS AUREUS 07/30/2010     Lab Results  Component Value Date   ALBUMIN 3.6 09/20/2022   ALBUMIN 4.1 04/11/2014    ALBUMIN 4.3 01/21/2013    No results found for: "MG" Lab Results  Component Value Date   VD25OH 47 04/11/2014   VD25OH 45 01/21/2013    No results found for: "PREALBUMIN"    Latest Ref Rng & Units 09/20/2022    5:40 AM 04/11/2014    9:40 AM 01/21/2013    2:14 PM  CBC EXTENDED  WBC 4.0 - 10.5 K/uL 6.2  5.8  6.9   RBC 3.87 - 5.11 MIL/uL 4.42  4.66  4.87   Hemoglobin 12.0 - 15.0 g/dL 08.6  57.8  46.9   HCT 36.0 - 46.0 % 42.4  41.8  42.7   Platelets 150 - 400 K/uL 167  190  192   NEUT# 1.7 - 7.7 K/uL 3.6  3.2  4.3   Lymph# 0.7 - 4.0 K/uL 1.6  1.9  1.9      There is no height or weight on file to calculate BMI.  Orders:  No orders of the defined types were placed in this encounter.  No orders of the defined types were placed in this encounter.    Procedures: No procedures performed  Clinical Data: No additional findings.  ROS:  All other systems negative, except as noted in the HPI. Review of Systems  Objective: Vital Signs: There were no vitals  taken for this visit.  Specialty Comments:  No specialty comments available.  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   Cellulitis of fourth toe of right foot 09/20/2022   Wound dehiscence, surgical 09/20/2022   Hordeolum internum of right lower eyelid 02/10/2022   Acute sinusitis 03/01/2020   Infection of breast implant (HCC) 03/01/2020   Rosacea blepharoconjunctivitis 01/26/2020   Contact dermatitis of left eyelid 01/26/2020   Encounter for counseling 11/09/2019   Right flank pain 08/15/2013   OAB (overactive bladder) 08/15/2013   Osteopenia 09/12/2011   Underweight 03/19/2011   Migraine    Menopause    Past Medical History:  Diagnosis Date   Complication of anesthesia    Hypertension    Infection of breast implant (HCC) 03/01/2020   Menopause    Migraine    Dr. Sandria Manly   PONV (postoperative nausea and vomiting)    Raynaud's disease      Family History  Problem Relation Age of Onset   COPD Mother    Cancer Father        prostate    Past Surgical History:  Procedure Laterality Date   BREAST ENHANCEMENT SURGERY  2012   CATARACT EXTRACTION Bilateral    DERMOID CYST  EXCISION  1980   EYE SURGERY Left    EYE SURGERY Bilateral    Lid sx   FOOT SURGERY Right    x 2   I & D EXTREMITY Right 09/20/2022   Procedure: RIGHT FOOT DEBRIDEMENT;  Surgeon: Nadara Mustard, MD;  Location: [facility] OR;  Service: Orthopedics;  Laterality: Right;   NECK SURGERY  2007   fusion C6/7   Social History   Occupational History   Not on file  Tobacco Use   Smoking status: Never   Smokeless tobacco: Never  Vaping Use   Vaping status: Never Used  Substance and Sexual Activity   Alcohol use: Yes    Alcohol/week: 3.0 standard drinks of alcohol    Types: 3 Standard drinks or equivalent per week   Drug use: No   Sexual activity: Yes    Birth control/protection: Post-menopausal

## 2023-04-25 ENCOUNTER — Ambulatory Visit
Payer: No Typology Code available for payment source | Attending: Cardiovascular Disease | Admitting: Cardiovascular Disease

## 2023-04-25 ENCOUNTER — Encounter: Payer: Self-pay | Admitting: Cardiovascular Disease

## 2023-04-25 VITALS — BP 110/66 | HR 64 | Ht 67.0 in | Wt 111.8 lb

## 2023-04-25 DIAGNOSIS — I700 Atherosclerosis of aorta: Secondary | ICD-10-CM

## 2023-04-25 DIAGNOSIS — I10 Essential (primary) hypertension: Secondary | ICD-10-CM | POA: Diagnosis not present

## 2023-04-25 DIAGNOSIS — I3139 Other pericardial effusion (noninflammatory): Secondary | ICD-10-CM | POA: Diagnosis not present

## 2023-04-25 DIAGNOSIS — I5189 Other ill-defined heart diseases: Secondary | ICD-10-CM | POA: Diagnosis not present

## 2023-04-25 NOTE — Progress Notes (Signed)
Cardiology Office Note:    Date:  04/25/2023   ID:  Claudia Rivera, DOB 1955/12/16, MRN [id number]  PCP:  Dani Gobble, PA-C   [facility] Providers Cardiologist:  None     Referring MD: [name], MD   Chief Complaint  Patient presents with   Pericardial Effusion    History of Present Illness:    Claudia Rivera is a 67 y.o. female self-referred for evaluation of pericardial effusion.  The patient recently had a lung cancer screening CT that showed no pleural effusion or pneumothorax, mild to moderate centrilobular emphysema, biapical pleural thickening, no coronary calcifications, no cardiomegaly, and a small pericardial effusion.  She is referred for evaluation of the pericardial effusion that was recently identified.  The patient had an echocardiogram in 2020 that showed normal LVEF with evidence of diastolic dysfunction.  A coronary CTA in 2020 showed a coronary calcium score of 0 with normal coronary origins and evidence of CAD.  Atherosclerosis of the descending aorta visualized.  The patient is here with her husband today.  She has no cardiac-related complaints.  She denies shortness of breath, chest pain, chest pressure, edema, heart palpitations, orthopnea, or PND.  She has Raynaud's syndrome but no other evidence of vasculitic or rheumatologic disease.  She is a former smoker.  Current Medications: Current Meds  Medication Sig   acetaminophen (TYLENOL) 650 MG CR tablet Take 1,300 mg by mouth in the morning and at bedtime.   Lactobacillus (PROBIOTIC ACIDOPHILUS PO) Take 1 capsule by mouth in the morning.   LEXAPRO 10 MG tablet TAKE 1 TABLET (10 MG TOTAL) BY MOUTH DAILY.   losartan (COZAAR) 25 MG tablet Take 1 tablet (25 mg total) by mouth daily.   naproxen sodium (ALEVE) 220 MG tablet Take 440 mg by mouth in the morning and at bedtime.   Omega-3 Fatty Acids (FISH OIL PO) Take 1 capsule by mouth in the morning.   rizatriptan (MAXALT) 10 MG tablet TAKE  1 TABLET BY MOUTH AS NEEDED FOR MIGRAINE   rosuvastatin (CRESTOR) 5 MG tablet Take 5 mg by mouth in the morning.   VYVANSE 40 MG capsule Take 40 mg by mouth every morning.     Allergies:   Codeine and Scopolamine   ROS:   Please see the history of present illness.    All other systems reviewed and are negative.  EKGs/Labs/Other Studies Reviewed:    The following studies were reviewed today: Cardiac Studies & Procedures       ECHOCARDIOGRAM  ECHOCARDIOGRAM COMPLETE 03/08/2019  Narrative ECHOCARDIOGRAM REPORT    Patient Name:   Claudia Rivera  Date of Exam: 03/08/2019 Medical Rec #:  [id number]     Height:       66.5 in Accession #:    [id number]    Weight:       112.0 lb Date of Birth:  [birth year]    BSA:          1.57 m Patient Age:    62 years      BP:           124/94 mmHg Patient Gender: F             HR:           77 bpm. Exam Location:  [address]   Procedure: 2D Echo, 3D Echo, Cardiac Doppler, Color Doppler and Strain Analysis  Indications:    R94.31. Abnormal EKG. R06.02 Shortness of breath  History:  Patient has no prior history of Echocardiogram examinations.  Sonographer:    Garald Braver, RDCS Referring Phys: [phone] Barry Dienes [facility]   Sonographer Comments: Suboptimal parasternal window. Breast implants. IMPRESSIONS   1. The left ventricle has normal systolic function, with an ejection fraction of 55-60%. The cavity size was normal. There is mildly increased left ventricular wall thickness. Left ventricular diastolic Doppler parameters are consistent with impaired relaxation. Elevated left atrial and left ventricular end-diastolic pressures The E/e' is >15. No evidence of left ventricular regional wall motion abnormalities. 2. The right ventricle has normal systolic function. The cavity was normal. There is no increase in right ventricular wall thickness. 3. The mitral valve is abnormal. Mild thickening of the mitral valve leaflet. 4. The tricuspid valve  is grossly normal. 5. The aortic valve is tricuspid. No stenosis of the aortic valve. 6. The aorta is normal unless otherwise noted. 7. The average left ventricular global longitudinal strain is -15.7 %.  SUMMARY  LVEF 55-60%, mild LVH, normal wall motion, grade 1 DD, elevated LV filling pressure, normal RV systolic function, no significant TR, normal IVC, no pericardial effusion, mildly abnormal GLS -16% FINDINGS Left Ventricle: The left ventricle has normal systolic function, with an ejection fraction of 55-60%. The cavity size was normal. There is mildly increased left ventricular wall thickness. Left ventricular diastolic Doppler parameters are consistent with impaired relaxation. Elevated left atrial and left ventricular end-diastolic pressures The E/e' is >15. No evidence of left ventricular regional wall motion abnormalities.. The average left ventricular global longitudinal strain is -15.7 %.  Right Ventricle: The right ventricle has normal systolic function. The cavity was normal. There is no increase in right ventricular wall thickness.  Left Atrium: Left atrial size was normal in size.  Right Atrium: Right atrial size was normal in size. Right atrial pressure is estimated at 3 mmHg.  Interatrial Septum: No atrial level shunt detected by color flow Doppler.  Pericardium: There is no evidence of pericardial effusion.  Mitral Valve: The mitral valve is abnormal. Mild thickening of the mitral valve leaflet. Mitral valve regurgitation is trivial by color flow Doppler.  Tricuspid Valve: The tricuspid valve is grossly normal. Tricuspid valve regurgitation was not visualized by color flow Doppler.  Aortic Valve: The aortic valve is tricuspid Aortic valve regurgitation was not visualized by color flow Doppler. There is No stenosis of the aortic valve.  Pulmonic Valve: The pulmonic valve was grossly normal. Pulmonic valve regurgitation is not visualized by color flow Doppler.  Aorta:  The aorta is normal unless otherwise noted.  Venous: The inferior vena cava is normal in size with greater than 50% respiratory variability.   +--------------+--------++ LEFT VENTRICLE         +----------------+---------++ +--------------+--------++ Diastology                PLAX 2D                +----------------+---------++ +--------------+--------++ LV e' lateral:  4.88 cm/s LVIDd:        4.30 cm  +----------------+---------++ +--------------+--------++ LV E/e' lateral:16.1      LVIDs:        3.00 cm  +----------------+---------++ +--------------+--------++ LV e' medial:   5.33 cm/s LV PW:        0.70 cm  +----------------+---------++ +--------------+--------++ LV E/e' medial: 14.8      LV IVS:       0.80 cm  +----------------+---------++ +--------------+--------++ LVOT diam:    2.00 cm  +----------------------+-------++ +--------------+--------++ 2D Longitudinal  Strain        LV SV:        48 ml    +----------------------+-------++ +--------------+--------++ 2D Strain GLS (A2C):  -14.6 % LV SV Index:  31.36    +----------------------+-------++ +--------------+--------++ 2D Strain GLS (A3C):  -12.4 % LVOT Area:    3.14 cm +----------------------+-------++ +--------------+--------++ 2D Strain GLS (A4C):  -20.1 %                        +----------------------+-------++ +--------------+--------++ 2D Strain GLS Avg:    -15.7 % +----------------------+-------++  +-------------+------++ 3D Volume EF:       +-------------+------++ 3D EF:       56 %   +-------------+------++ LV EDV:      106 ml +-------------+------++ LV ESV:      47 ml  +-------------+------++ LV SV:       59 ml  +-------------+------++  +---------------+----------++ RIGHT VENTRICLE           +---------------+----------++ RV S prime:    11.30  cm/s +---------------+----------++  +---------------+-------++-----------++ LEFT ATRIUM           Index       +---------------+-------++-----------++ LA diam:       2.90 cm1.85 cm/m  +---------------+-------++-----------++ LA Vol (A2C):  23.3 ml14.82 ml/m +---------------+-------++-----------++ LA Vol (A4C):  28.4 ml18.07 ml/m +---------------+-------++-----------++ LA Biplane Vol:26.9 ml17.11 ml/m +---------------+-------++-----------++ +------------+---------++----------++ RIGHT ATRIUM         Index      +------------+---------++----------++ RA Pressure:3.00 mmHg           +------------+---------++----------++ RA Area:    6.31 cm            +------------+---------++----------++ RA Volume:  8.53 ml  5.43 ml/m +------------+---------++----------++ +------------+-----------++ AORTIC VALVE            +------------+-----------++ LVOT Vmax:  114.00 cm/s +------------+-----------++ LVOT Vmean: 76.600 cm/s +------------+-----------++ LVOT VTI:   0.256 m     +------------+-----------++  +-------------+-------++ AORTA                +-------------+-------++ Ao Root diam:3.30 cm +-------------+-------++  +--------------+--------++   +---------------+---------++ MITRAL VALVE             TRICUSPID VALVE          +--------------+--------++   +---------------+---------++                          Estimated RAP: 3.00 mmHg +--------------+--------++   +---------------+---------++                        +--------------+--------++   +--------------+-------+ MV Decel Time:144 msec   SHUNTS                +--------------+--------++   +--------------+-------+ +--------------+----------++ Systemic VTI: 0.26 m  MV E velocity:78.80 cm/s +--------------+-------+ +--------------+----------++ Systemic Diam:2.00 cm MV A velocity:84.00 cm/s  +--------------+-------+ +--------------+----------++ MV E/A ratio: 0.94       +--------------+----------++   Zoila Shutter MD Electronically signed by Zoila Shutter MD Signature Date/Time: 03/08/2019/2:22:33 PM    Final     CT SCANS  CT CORONARY MORPH W/CTA COR W/SCORE 03/12/2019  Addendum 03/12/2019  3:55 PM ADDENDUM REPORT: 03/12/2019 15:52  CLINICAL DATA:  66F with abdominal atherosclerosis and ADHD referred for exertional fatigue and dyspnea.  EXAM: Cardiac/Coronary  CT  TECHNIQUE: The patient was scanned on a [employer].  FINDINGS: A 120 kV prospective scan was triggered in the descending  thoracic aorta at 111 HU's. Axial non-contrast 3 mm slices were carried out through the heart. The data set was analyzed on a dedicated work station and scored using the Agatson method. Gantry rotation speed was 250 msecs and collimation was .6 mm. No beta blockade and 0.8 mg of sl NTG was given. The 3D data set was reconstructed in 5% intervals of the 67-82 % of the R-R cycle. Diastolic phases were analyzed on a dedicated work station using MPR, MIP and VRT modes. The patient received 80 cc of contrast.  Aorta: Normal size. Ascending aorta 2.9 cm. Mild calcification of the descending aorta. No dissection.  Aortic Valve:  Trileaflet.  No calcifications.  Coronary Arteries:  Normal coronary origin.  Right dominance.  RCA is a large dominant artery that gives rise to PDA and PLVB. There is no plaque.  Left main is a large artery that gives rise to LAD, LCX and RI arteries.  LAD is a large vessel that has no plaque.  LCX is a non-dominant artery that gives rise to a small OM1 and a large OM2 branch. There is no plaque.  RI branches at the ostium and is without disease.  Other findings:  Normal pulmonary vein drainage into the left atrium.  Normal let atrial appendage without a thrombus.  Normal size of the pulmonary artery.  IMPRESSION: 1.  Coronary calcium score of 0. This was 0 percentile for age and sex matched control.  2. Normal coronary origin with right dominance.  3. No evidence of CAD.  4. Mild atherosclerosis of the descending aorta.  Chilton Si, MD   Electronically Signed By: Chilton Si On: 03/12/2019 15:52  Narrative EXAM: OVER-READ INTERPRETATION  CT CHEST  The following report is an over-read performed by radiologist Dr. Charlett Nose of [facility] on 03/12/2019. This over-read does not include interpretation of cardiac or coronary anatomy or pathology. The coronary CTA interpretation by the cardiologist is attached.  COMPARISON:  06/26/2011  FINDINGS: Vascular: Heart is normal size.  Aorta is normal caliber.  Mediastinum/Nodes: No adenopathy in the lower mediastinum or hila.  Lungs/Pleura: Visualized lungs clear.  No effusions.  Upper Abdomen: Imaging into the upper abdomen shows no acute findings.  Musculoskeletal: Chest wall soft tissues are unremarkable. Bilateral breast implants noted. No acute bony abnormality.  IMPRESSION: No acute or significant extracardiac abnormality.  Electronically Signed: By: Charlett Nose M.D. On: 03/12/2019 15:33          EKG:   EKG Interpretation Date/Time:  Friday April 25 2023 15:35:45 EDT Ventricular Rate:  64 PR Interval:  170 QRS Duration:  96 QT Interval:  402 QTC Calculation: 414 R Axis:   -15  Text Interpretation: Normal sinus rhythm Minimal voltage criteria for LVH, may be normal variant ( Cornell product ) Septal infarct (cited on or before 20-Sep-2022) When compared with ECG of 20-Sep-2022 05:52, No significant change was found Confirmed by Tonny Bollman [phone]) on 04/25/2023 3:50:19 PM    Recent Labs: 09/20/2022: ALT 21; BUN 21; Creatinine, Ser 0.79; Hemoglobin 14.3; Platelets 167; Potassium 4.1; Sodium 140  Recent Lipid Panel    Component Value Date/Time   CHOL 210 (H) 04/11/2014 0940   TRIG 55  04/11/2014 0940   HDL 104 04/11/2014 0940   CHOLHDL 2.0 04/11/2014 0940   VLDL 11 04/11/2014 0940   LDLCALC 95 04/11/2014 0940     Risk Assessment/Calculations:                Physical  Exam:    VS:  BP 110/66 (BP Location: Left Arm, Patient Position: Sitting, Cuff Size: Normal)   Pulse 64   Ht 5\' 7"  (1.702 m)   Wt 111 lb 12.8 oz (50.7 kg)   SpO2 96%   BMI 17.51 kg/m     Wt Readings from Last 3 Encounters:  04/25/23 111 lb 12.8 oz (50.7 kg)  09/20/22 115 lb (52.2 kg)  12/06/19 111 lb 1.9 oz (50.4 kg)     GEN:  Well nourished, well developed in no acute distress HEENT: Normal NECK: No JVD; No carotid bruits LYMPHATICS: No lymphadenopathy CARDIAC: RRR, no murmurs, rubs, gallops RESPIRATORY:  Clear to auscultation without rales, wheezing or rhonchi  ABDOMEN: Soft, non-tender, non-distended MUSCULOSKELETAL:  No edema; No deformity  SKIN: Warm and dry NEUROLOGIC:  Alert and oriented x 3 PSYCHIATRIC:  Normal affect   Assessment & Plan Pericardial effusion Patient with a small pericardial effusion on cross-sectional imaging with a CT of the chest.  I recommended an echocardiogram to evaluate for pericardial effusion.  We briefly discussed differential diagnosis of pericardial effusion.  She has no other concerning findings on her chest CT, specifically no suspicious nodules, masses, or adenopathy.  Patient is completely asymptomatic. Diastolic dysfunction Findings of diastolic dysfunction noted on prior echo from 2020.  Will compare her current study which is ordered today.  Patient is asymptomatic with well-controlled blood pressure.  No exertional shortness of breath or other symptoms. Essential hypertension Blood pressure under ideal control on a low-dose of losartan. Aortic atherosclerosis (HCC) Incidentally seen on screening CT.  Patient on low-dose rosuvastatin.  Most recent LDL cholesterol 68.       Medication Adjustments/Labs and Tests Ordered: Current  medicines are reviewed at length with the patient today.  Concerns regarding medicines are outlined above.  Orders Placed This Encounter  Procedures   EKG 12-Lead   ECHOCARDIOGRAM COMPLETE   No orders of the defined types were placed in this encounter.   Patient Instructions  Medication Instructions:  Your physician recommends that you continue on your current medications as directed. Please refer to the Current Medication list given to you today.  *If you need a refill on your cardiac medications before your next appointment, please call your pharmacy*  Testing/Procedures: ECHO Your physician has requested that you have an echocardiogram. Echocardiography is a painless test that uses sound waves to create images of your heart. It provides your doctor with information about the size and shape of your heart and how well your heart's chambers and valves are working. This procedure takes approximately one hour. There are no restrictions for this procedure. Please do NOT wear cologne, perfume, aftershave, or lotions (deodorant is allowed). Please arrive 15 minutes prior to your appointment time.  Follow-Up: At [facility], you and your health needs are our priority.  As part of our continuing mission to provide you with exceptional heart care, we have created designated Provider Care Teams.  These Care Teams include your primary Cardiologist (physician) and Advanced Practice Providers (APPs -  Physician Assistants and Nurse Practitioners) who all work together to provide you with the care you need, when you need it.  Your next appointment:   1 year(s)  Provider:   Tonny Bollman, MD     Signed, Tonny Bollman, MD  04/25/2023 5:03 PM    [facility]

## 2023-04-25 NOTE — Patient Instructions (Signed)
Medication Instructions:  Your physician recommends that you continue on your current medications as directed. Please refer to the Current Medication list given to you today.  *If you need a refill on your cardiac medications before your next appointment, please call your pharmacy*  Testing/Procedures: ECHO Your physician has requested that you have an echocardiogram. Echocardiography is a painless test that uses sound waves to create images of your heart. It provides your doctor with information about the size and shape of your heart and how well your heart's chambers and valves are working. This procedure takes approximately one hour. There are no restrictions for this procedure. Please do NOT wear cologne, perfume, aftershave, or lotions (deodorant is allowed). Please arrive 15 minutes prior to your appointment time.  Follow-Up: At [facility], you and your health needs are our priority.  As part of our continuing mission to provide you with exceptional heart care, we have created designated Provider Care Teams.  These Care Teams include your primary Cardiologist (physician) and Advanced Practice Providers (APPs -  Physician Assistants and Nurse Practitioners) who all work together to provide you with the care you need, when you need it.  Your next appointment:   1 year(s)  Provider:   Tonny Bollman, MD

## 2023-05-28 ENCOUNTER — Ambulatory Visit (HOSPITAL_COMMUNITY): Payer: No Typology Code available for payment source | Attending: Cardiology

## 2023-05-28 DIAGNOSIS — I5189 Other ill-defined heart diseases: Secondary | ICD-10-CM | POA: Diagnosis not present

## 2023-05-28 DIAGNOSIS — I3139 Other pericardial effusion (noninflammatory): Secondary | ICD-10-CM | POA: Diagnosis present

## 2023-05-28 LAB — ECHOCARDIOGRAM COMPLETE
Area-P 1/2: 2.78 cm2
S' Lateral: 3.1 cm

## 2023-05-29 ENCOUNTER — Ambulatory Visit (HOSPITAL_COMMUNITY): Payer: No Typology Code available for payment source

## 2023-11-06 ENCOUNTER — Other Ambulatory Visit (INDEPENDENT_AMBULATORY_CARE_PROVIDER_SITE_OTHER): Payer: Self-pay

## 2023-11-06 ENCOUNTER — Ambulatory Visit: Admitting: Orthopedic Surgery

## 2023-11-06 DIAGNOSIS — M79671 Pain in right foot: Secondary | ICD-10-CM

## 2023-11-26 ENCOUNTER — Encounter: Payer: Self-pay | Admitting: Orthopedic Surgery

## 2023-11-26 NOTE — Progress Notes (Addendum)
 Office Visit Note   Patient: Claudia Rivera           Date of Birth: [birth year]           MRN: [id number] Visit Date: 11/06/2023              Requested by: Jearlean Mince, PA-C [address],  [place] [address] PCP: Jearlean Mince, PA-C  Chief Complaint  Patient presents with   Right Foot - Pain      HPI: Patient is a 68 year old woman who is seen in follow-up for her right foot.  Patient has had surgical intervention for complications with wound healing.  Patient states she has had acute swelling for about a month.  Patients  previous wound healed and she was on doxycycline .  Assessment & Plan: Visit Diagnoses:  1. Right foot pain     Plan: Recommended stiff soled sneakers arch support.  With lack of a dorsalis pedis artery patient is not a good candidate for Weil osteotomies for the 2nd and 3rd metatarsals.  Follow-Up Instructions: Return if symptoms worsen or fail to improve.   Ortho Exam  Patient is alert, oriented, no adenopathy, well-dressed, normal affect, normal respiratory effort. Examination patient does not have a palpable dorsalis pedis pulse she does have a palpable posterior tibial pulse.  She has pain to palpation beneath the second metatarsal head and second toe PIP joint.  There is sausage digit swelling of the second toe PIP joint.  There is no cellulitis no ulcers.  Imaging: No results found. No images are attached to the encounter.  Labs: Lab Results  Component Value Date   ESRSEDRATE 2 09/20/2022   CRP <0.5 09/20/2022   CRP <0.02 06/26/2011   REPTSTATUS 09/25/2022 FINAL 09/20/2022   GRAMSTAIN NO WBC SEEN NO ORGANISMS SEEN  09/20/2022   CULT  09/20/2022    No growth aerobically or anaerobically. Performed at [facility], [address] [place], [place] [address]    [facility] STAPHYLOCOCCUS AUREUS 07/30/2010     Lab Results  Component Value Date   ALBUMIN 3.6 09/20/2022   ALBUMIN 4.1 04/11/2014    ALBUMIN 4.3 01/21/2013    No results found for: "MG" Lab Results  Component Value Date   VD25OH 47 04/11/2014   VD25OH 45 01/21/2013    No results found for: "PREALBUMIN"    Latest Ref Rng & Units 09/20/2022    5:40 AM 04/11/2014    9:40 AM 01/21/2013    2:14 PM  CBC EXTENDED  WBC 4.0 - 10.5 K/uL 6.2  5.8  6.9   RBC 3.87 - 5.11 MIL/uL 4.42  4.66  4.87   Hemoglobin 12.0 - 15.0 g/dL 62.1  30.8  65.7   HCT 36.0 - 46.0 % 42.4  41.8  42.7   Platelets 150 - 400 K/uL 167  190  192   NEUT# 1.7 - 7.7 K/uL 3.6  3.2  4.3   Lymph# 0.7 - 4.0 K/uL 1.6  1.9  1.9      There is no height or weight on file to calculate BMI.  Orders:  Orders Placed This Encounter  Procedures   XR Foot Complete Right   No orders of the defined types were placed in this encounter.    Procedures: No procedures performed  Clinical Data: No additional findings.  ROS:  All other systems negative, except as noted in the HPI. Review of Systems  Objective: Vital Signs: There were  no vitals taken for this visit.  Specialty Comments:  No specialty comments available.  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   Cellulitis of fourth toe of right foot 09/20/2022   Wound dehiscence, surgical 09/20/2022   Hordeolum internum of right lower eyelid 02/10/2022   Acute sinusitis 03/01/2020   Infection of breast implant (HCC) 03/01/2020   Rosacea blepharoconjunctivitis 01/26/2020   Contact dermatitis of left eyelid 01/26/2020   Encounter for counseling 11/09/2019   Right flank pain 08/15/2013   OAB (overactive bladder) 08/15/2013   Osteopenia 09/12/2011   Underweight 03/19/2011   Migraine    Menopause    Past Medical History:  Diagnosis Date   Complication of anesthesia    Hypertension    Infection of breast implant (HCC) 03/01/2020   Menopause    Migraine    Dr. Dania Dupre   PONV (postoperative nausea and vomiting)    Raynaud's disease      Family History  Problem Relation Age of Onset   COPD Mother    Cancer Father        prostate    Past Surgical History:  Procedure Laterality Date   BREAST ENHANCEMENT SURGERY  2012   CATARACT EXTRACTION Bilateral    DERMOID CYST  EXCISION  1980   EYE SURGERY Left    EYE SURGERY Bilateral    Lid sx   FOOT SURGERY Right    x 2   I & D EXTREMITY Right 09/20/2022   Procedure: RIGHT FOOT DEBRIDEMENT;  Surgeon: Timothy Ford, MD;  Location: [facility] OR;  Service: Orthopedics;  Laterality: Right;   NECK SURGERY  2007   fusion C6/7   Social History   Occupational History   Not on file  Tobacco Use   Smoking status: Never   Smokeless tobacco: Never  Vaping Use   Vaping status: Never Used  Substance and Sexual Activity   Alcohol use: Yes    Alcohol/week: 3.0 standard drinks of alcohol    Types: 3 Standard drinks or equivalent per week   Drug use: No   Sexual activity: Yes    Birth control/protection: Post-menopausal

## 2024-01-08 ENCOUNTER — Other Ambulatory Visit: Payer: Self-pay | Admitting: Physician Assistant

## 2024-01-08 DIAGNOSIS — Z136 Encounter for screening for cardiovascular disorders: Secondary | ICD-10-CM

## 2024-01-08 DIAGNOSIS — Z9882 Breast implant status: Secondary | ICD-10-CM

## 2024-01-09 ENCOUNTER — Other Ambulatory Visit

## 2024-01-27 ENCOUNTER — Ambulatory Visit
Admission: RE | Admit: 2024-01-27 | Discharge: 2024-01-27 | Disposition: A | Discharge status: Home / Self Care | Source: Ambulatory Visit | Attending: Physician Assistant | Admitting: Physician Assistant

## 2024-01-27 DIAGNOSIS — Z9882 Breast implant status: Secondary | ICD-10-CM

## 2024-01-27 MED ORDER — GADOPICLENOL 0.5 MMOL/ML IV SOLN
5.0000 mL | Freq: Once | INTRAVENOUS | Status: AC | PRN
  Administered 2024-01-27: 5 mL via INTRAVENOUS

## 2024-02-20 ENCOUNTER — Emergency Department (HOSPITAL_COMMUNITY)

## 2024-02-20 ENCOUNTER — Emergency Department (HOSPITAL_COMMUNITY)
Admission: EM | Admit: 2024-02-20 | Discharge: 2024-02-20 | Disposition: A | Discharge status: Home / Self Care | Attending: Emergency Medicine | Admitting: Emergency Medicine

## 2024-02-20 ENCOUNTER — Encounter (HOSPITAL_COMMUNITY): Payer: Self-pay | Admitting: Emergency Medicine

## 2024-02-20 ENCOUNTER — Other Ambulatory Visit: Payer: Self-pay

## 2024-02-20 DIAGNOSIS — W1809XA Striking against other object with subsequent fall, initial encounter: Secondary | ICD-10-CM | POA: Diagnosis not present

## 2024-02-20 DIAGNOSIS — S299XXA Unspecified injury of thorax, initial encounter: Secondary | ICD-10-CM | POA: Diagnosis present

## 2024-02-20 DIAGNOSIS — M25511 Pain in right shoulder: Secondary | ICD-10-CM | POA: Insufficient documentation

## 2024-02-20 DIAGNOSIS — W19XXXA Unspecified fall, initial encounter: Secondary | ICD-10-CM

## 2024-02-20 DIAGNOSIS — Y92481 Parking lot as the place of occurrence of the external cause: Secondary | ICD-10-CM | POA: Diagnosis not present

## 2024-02-20 DIAGNOSIS — S0003XA Contusion of scalp, initial encounter: Secondary | ICD-10-CM | POA: Diagnosis not present

## 2024-02-20 DIAGNOSIS — S2242XA Multiple fractures of ribs, left side, initial encounter for closed fracture: Secondary | ICD-10-CM | POA: Insufficient documentation

## 2024-02-20 LAB — CBC
HCT: 41.4 % (ref 36.0–46.0)
Hemoglobin: 13.8 g/dL (ref 12.0–15.0)
MCH: 30.9 pg (ref 26.0–34.0)
MCHC: 33.3 g/dL (ref 30.0–36.0)
MCV: 92.6 fL (ref 80.0–100.0)
Platelets: 174 K/uL (ref 150–400)
RBC: 4.47 MIL/uL (ref 3.87–5.11)
RDW: 12.4 % (ref 11.5–15.5)
WBC: 6.1 K/uL (ref 4.0–10.5)
nRBC: 0 % (ref 0.0–0.2)

## 2024-02-20 LAB — BASIC METABOLIC PANEL WITH GFR
Anion gap: 15 (ref 5–15)
BUN: 17 mg/dL (ref 8–23)
CO2: 21 mmol/L — ABNORMAL LOW (ref 22–32)
Calcium: 8.4 mg/dL — ABNORMAL LOW (ref 8.9–10.3)
Chloride: 104 mmol/L (ref 98–111)
Creatinine, Ser: 0.67 mg/dL (ref 0.44–1.00)
GFR, Estimated: >60 mL/min (ref >60)
Glucose, Bld: 112 mg/dL — ABNORMAL HIGH (ref 70–99)
Potassium: 3.2 mmol/L — ABNORMAL LOW (ref 3.5–5.1)
Sodium: 140 mmol/L (ref 135–145)

## 2024-02-20 MED ORDER — ACETAMINOPHEN 325 MG PO TABS
650.0000 mg | ORAL_TABLET | Freq: Once | ORAL | Status: AC
  Administered 2024-02-20: 650 mg via ORAL
  Filled 2024-02-20: qty 2

## 2024-02-20 MED ORDER — KETOROLAC TROMETHAMINE 30 MG/ML IJ SOLN
30.0000 mg | Freq: Once | INTRAMUSCULAR | Status: AC
  Administered 2024-02-20: 30 mg via INTRAVENOUS
  Filled 2024-02-20: qty 1

## 2024-02-20 NOTE — ED Triage Notes (Signed)
 Pt fell at a restaurant, pt walking out afterward. Pt fell and hit her head. Pt had some ETOH. Pt vomited and was incontinent.

## 2024-02-20 NOTE — ED Provider Notes (Signed)
 [place] [facility]ERGENCY DEPARTMENT AT [facility] Provider Note   CSN: [id number] Arrival date & time: 02/20/24  2147     Patient presents with: Claudia Rivera is a 68 y.o. female here with a fall. Her husband reports she had wine at dinner that made her woozy and she got nauseous and fell in the parking lot, slamming her back of the head on the concrete.  She vomited afterwards.  She is not on A/C.  She complains of headache and right posterior shoulder pain.   HPI     Prior to Admission medications   Medication Sig Start Date End Date Taking? Authorizing Provider  acetaminophen  (TYLENOL ) 650 MG CR tablet Take 1,300 mg by mouth in the morning and at bedtime.    [provider]  cephALEXin (KEFLEX) 500 MG capsule Take 500 mg by mouth 4 (four) times daily. 09/12/22   [provider]  doxycycline  (VIBRA -TABS) 100 MG tablet Take 100 mg by mouth 2 (two) times daily. 09/12/22   [provider]  doxycycline  (VIBRA -TABS) 100 MG tablet TAKE 1 TABLET BY MOUTH TWICE A DAY 02/04/23   Harden Jerona GAILS, MD  Lactobacillus (PROBIOTIC ACIDOPHILUS PO) Take 1 capsule by mouth in the morning.    [provider]  LEXAPRO  10 MG tablet TAKE 1 TABLET (10 MG TOTAL) BY MOUTH DAILY. 04/26/14   Schoenhoff, Barnie BIRCH, MD  losartan  (COZAAR ) 25 MG tablet Take 1 tablet (25 mg total) by mouth daily. 12/06/19   Gerhardt, Lori C, NP  naproxen sodium (ALEVE) 220 MG tablet Take 440 mg by mouth in the morning and at bedtime.    [provider]  nitroGLYCERIN  (NITRODUR - DOSED IN MG/24 HR) 0.2 mg/hr patch Place 1 patch (0.2 mg total) onto the skin daily. Patient taking differently: Place 0.25 patches onto the skin daily. Apply 0.25 patch to right leg once daily 09/17/22   Duda, Marcus V, MD  Omega-3 Fatty Acids (FISH OIL PO) Take 1 capsule by mouth in the morning.    [provider]  pentoxifylline  (TRENTAL ) 400 MG CR tablet Take 1 tablet (400 mg total) by mouth 3  (three) times daily with meals. 09/17/22   Harden Jerona GAILS, MD  rizatriptan (MAXALT) 10 MG tablet TAKE 1 TABLET BY MOUTH AS NEEDED FOR MIGRAINE 01/19/14   Draper, Timothy R, DO  rosuvastatin (CRESTOR) 5 MG tablet Take 5 mg by mouth in the morning. 04/13/21   [provider]  VYVANSE 40 MG capsule Take 40 mg by mouth every morning. 11/23/19   [provider]    Allergies: Codeine and Scopolamine     Review of Systems  Updated Vital Signs BP 93/62 (BP Location: Right Arm)   Pulse 74   Temp (!) 97 F (36.1 C) (Temporal)   Resp 16   SpO2 98%   Physical Exam Constitutional:      General: She is not in acute distress. HENT:     Head: Normocephalic.     Comments: Contusion to parietal scalp Eyes:     Conjunctiva/sclera: Conjunctivae normal.     Pupils: Pupils are equal, round, and reactive to light.  Neck:     Comments: C spine collar in place No spinal midline tenderness Cardiovascular:     Rate and Rhythm: Normal rate and regular rhythm.  Pulmonary:     Effort: Pulmonary effort is normal. No respiratory distress.  Abdominal:     General: There is no distension.  Tenderness: There is no abdominal tenderness.  Musculoskeletal:     Comments: TTP right scalpula Full ROM of the shoulders and hips  Skin:    General: Skin is warm and dry.  Neurological:     General: No focal deficit present.     Mental Status: She is alert. Mental status is at baseline.     (all labs ordered are listed, but only abnormal results are displayed) Labs Reviewed  BASIC METABOLIC PANEL WITH GFR - Abnormal; Notable for the following components:      Result Value   Potassium 3.2 (*)    CO2 21 (*)    Glucose, Bld 112 (*)    Calcium 8.4 (*)    All other components within normal limits  CBC    EKG: None  Radiology: CT Head Wo Contrast Result Date: 02/20/2024 CLINICAL DATA:  Recent fall with headaches and neck pain, initial encounter EXAM: CT HEAD WITHOUT CONTRAST CT CERVICAL  SPINE WITHOUT CONTRAST TECHNIQUE: Multidetector CT imaging of the head and cervical spine was performed following the standard protocol without intravenous contrast. Multiplanar CT image reconstructions of the cervical spine were also generated. RADIATION DOSE REDUCTION: This exam was performed according to the departmental dose-optimization program which includes automated exposure control, adjustment of the mA and/or kV according to patient size and/or use of iterative reconstruction technique. COMPARISON:  None Available. FINDINGS: CT HEAD FINDINGS Brain: No evidence of acute infarction, hemorrhage, hydrocephalus, extra-axial collection or mass lesion/mass effect. Vascular: No hyperdense vessel or unexpected calcification. Skull: Normal. Negative for fracture or focal lesion. Sinuses/Orbits: No acute finding. Other: Mild scalp hematoma is noted in the left posterior parietal region near the vertex. CT CERVICAL SPINE FINDINGS Alignment: Within normal limits. Skull base and vertebrae: 7 cervical segments are well visualized. Vertebral body height is well maintained. Changes of prior fusion at C5-6 and C6-7 with anterior fixation are noted. Degenerative changes at C3-4 and C4-5 are seen. Mild facet hypertrophic changes are noted. No acute fracture or acute facet abnormality is noted. The odontoid is within normal limits. Soft tissues and spinal canal: Surrounding soft tissue structures are unremarkable. Upper chest: Visualized lung apices show emphysematous change. Other: None IMPRESSION: CT of the head: No acute intracranial abnormality noted. Small scalp hematoma in the left posterior parietal region. CT of the cervical spine: Postsurgical and degenerative changes without acute abnormality. Electronically Signed   By: Oneil Devonshire M.D.   On: 02/20/2024 22:35   CT Cervical Spine Wo Contrast Result Date: 02/20/2024 CLINICAL DATA:  Recent fall with headaches and neck pain, initial encounter EXAM: CT HEAD WITHOUT  CONTRAST CT CERVICAL SPINE WITHOUT CONTRAST TECHNIQUE: Multidetector CT imaging of the head and cervical spine was performed following the standard protocol without intravenous contrast. Multiplanar CT image reconstructions of the cervical spine were also generated. RADIATION DOSE REDUCTION: This exam was performed according to the departmental dose-optimization program which includes automated exposure control, adjustment of the mA and/or kV according to patient size and/or use of iterative reconstruction technique. COMPARISON:  None Available. FINDINGS: CT HEAD FINDINGS Brain: No evidence of acute infarction, hemorrhage, hydrocephalus, extra-axial collection or mass lesion/mass effect. Vascular: No hyperdense vessel or unexpected calcification. Skull: Normal. Negative for fracture or focal lesion. Sinuses/Orbits: No acute finding. Other: Mild scalp hematoma is noted in the left posterior parietal region near the vertex. CT CERVICAL SPINE FINDINGS Alignment: Within normal limits. Skull base and vertebrae: 7 cervical segments are well visualized. Vertebral body height is well maintained. Changes of prior  fusion at C5-6 and C6-7 with anterior fixation are noted. Degenerative changes at C3-4 and C4-5 are seen. Mild facet hypertrophic changes are noted. No acute fracture or acute facet abnormality is noted. The odontoid is within normal limits. Soft tissues and spinal canal: Surrounding soft tissue structures are unremarkable. Upper chest: Visualized lung apices show emphysematous change. Other: None IMPRESSION: CT of the head: No acute intracranial abnormality noted. Small scalp hematoma in the left posterior parietal region. CT of the cervical spine: Postsurgical and degenerative changes without acute abnormality. Electronically Signed   By: Oneil Devonshire M.D.   On: 02/20/2024 22:35   CT T-SPINE NO CHARGE Result Date: 02/20/2024 CLINICAL DATA:  Fall. EXAM: CT THORACIC SPINE WITHOUT CONTRAST TECHNIQUE: Multidetector  CT images of the thoracic were obtained using the standard protocol without intravenous contrast. RADIATION DOSE REDUCTION: This exam was performed according to the departmental dose-optimization program which includes automated exposure control, adjustment of the mA and/or kV according to patient size and/or use of iterative reconstruction technique. COMPARISON:  None Available. FINDINGS: Alignment: Normal. Vertebrae: No acute fracture or focal pathologic process. Paraspinal and other soft tissues: Negative. Disc levels: Disc spaces are preserved. Minimal degenerative endplate osteophytes are seen anteriorly. No significant central canal or neural foraminal stenosis identified level. Cervical spinal fusion plate is partially imaged. IMPRESSION: No acute fracture or traumatic subluxation of the thoracic spine. Electronically Signed   By: Greig Pique M.D.   On: 02/20/2024 22:35   CT Chest Wo Contrast Result Date: 02/20/2024 CLINICAL DATA:  Trauma.  Scapular fracture. EXAM: CT CHEST WITHOUT CONTRAST TECHNIQUE: Multidetector CT imaging of the chest was performed following the standard protocol without IV contrast. RADIATION DOSE REDUCTION: This exam was performed according to the departmental dose-optimization program which includes automated exposure control, adjustment of the mA and/or kV according to patient size and/or use of iterative reconstruction technique. COMPARISON:  None Available. FINDINGS: Cardiovascular: No significant vascular findings. Normal heart size. No pericardial effusion. Mediastinum/Nodes: No enlarged mediastinal or axillary lymph nodes. Thyroid gland, trachea, and esophagus demonstrate no significant findings. Lungs/Pleura: Mild emphysema seen in the lung apices along the apical scarring. No focal lung infiltrate, pleural effusion or pneumothorax. There is a 2 mm nodule in the right middle lobe image 4/115. Upper Abdomen: No acute abnormality. Musculoskeletal: Bilateral breast implants  present. There are acute anterolateral left third, fourth, fifth and sixth rib fractures, nondisplaced. Cervical spinal fusion plate is partially imaged. IMPRESSION: 1. Acute nondisplaced anterolateral left third through sixth rib fractures. No pneumothorax. 2. Right solid pulmonary nodule measuring 2 mm. No follow-up needed if patient is low-risk.This recommendation follows the consensus statement: Guidelines for Management of Incidental Pulmonary Nodules Detected on CT Images: From the Fleischner Society 2017; Radiology 2017; 284:228-243. 3. Emphysema. Emphysema (ICD10-J43.9). Electronically Signed   By: Greig Pique M.D.   On: 02/20/2024 22:33     Procedures   Medications Ordered in the ED  acetaminophen  (TYLENOL ) tablet 650 mg (650 mg Oral Given 02/20/24 2228)  ketorolac  (TORADOL ) 30 MG/ML injection 30 mg (30 mg Intravenous Given 02/20/24 2246)    Clinical Course as of 02/20/24 2317  Fri Feb 20, 2024  2243 Discussed rib fx with patient and her husband.  We'll give toradol  and [employer].  They'd prefer to go home which is reasonable if we can control her pain.  Discussed possible concussion syndrome and precautions at home [MT]    Clinical Course User Index [MT] Tensley Wery, Donnice PARAS, MD  Medical Decision Making Amount and/or Complexity of Data Reviewed Labs: ordered. Radiology: ordered.  Risk OTC drugs. Prescription drug management.   This patient presents to the ED with concern for mechanical fall, head injury.   Additional history obtained from patient's husband at bedside   I ordered and personally interpreted labs.  The pertinent results include:  no emergent findings, mild hypoK  I ordered imaging studies including CT head, C spine, Chest, T spine I independently visualized and interpreted imaging which showed left sided posterior rib fx I agree with the radiologist interpretation  I ordered medication including IV toradol  and  oral tylenol   I have reviewed the patients home medicines and have made adjustments as needed  Test Considered: doubt pelvic fracture  After the interventions noted above, I reevaluated the patient and found that they have: improved  Dispostion:  After consideration of the diagnostic results and the patients response to treatment, I feel that the patent would benefit from close outpatient follow up.  She lives with her husband who is an orthopedic physician, and I believe capable to care for her during these rib fracture recovery.  They are both comfortable going home.      Final diagnoses:  Fall, initial encounter  Contusion of scalp, initial encounter  Closed fracture of multiple ribs of left side, initial encounter    ED Discharge Orders     None          Shafin Pollio, Donnice PARAS, MD 02/20/24 2317

## 2024-02-20 NOTE — Discharge Instructions (Addendum)
 Please use the incentive spirometer at home to help prevent pneumonia.  For the next 10 days, you will take 10 slow breaths at a time inhalation -- roughly once every 1-2 hours while awake.

## 2024-03-08 ENCOUNTER — Ambulatory Visit
Admission: RE | Admit: 2024-03-08 | Discharge: 2024-03-08 | Disposition: A | Discharge status: Home / Self Care | Source: Ambulatory Visit | Attending: Orthopaedic Surgery

## 2024-03-08 ENCOUNTER — Other Ambulatory Visit: Payer: Self-pay | Admitting: Orthopaedic Surgery

## 2024-03-08 ENCOUNTER — Encounter: Payer: Self-pay | Admitting: Orthopaedic Surgery

## 2024-03-08 DIAGNOSIS — S2239XA Fracture of one rib, unspecified side, initial encounter for closed fracture: Secondary | ICD-10-CM

## 2024-03-08 DIAGNOSIS — S065XAA Traumatic subdural hemorrhage with loss of consciousness status unknown, initial encounter: Secondary | ICD-10-CM

## 2024-05-17 ENCOUNTER — Encounter: Payer: Self-pay | Admitting: Radiology
